# Patient Record
Sex: Male | Born: 1955 | Race: White | Hispanic: No | Marital: Married | Smoking: Never smoker
Health system: Southern US, Community
[De-identification: ages and names within clinical notes are randomized; demographics above are authoritative.]

## PROBLEM LIST (undated history)

## (undated) DIAGNOSIS — N400 Enlarged prostate without lower urinary tract symptoms: Secondary | ICD-10-CM

## (undated) DIAGNOSIS — G4730 Sleep apnea, unspecified: Secondary | ICD-10-CM

## (undated) DIAGNOSIS — R011 Cardiac murmur, unspecified: Secondary | ICD-10-CM

## (undated) DIAGNOSIS — I10 Essential (primary) hypertension: Secondary | ICD-10-CM

## (undated) HISTORY — PX: TONSILLECTOMY: SUR1361

## (undated) HISTORY — DX: Sleep apnea, unspecified: G47.30

## (undated) HISTORY — DX: Essential (primary) hypertension: I10

## (undated) HISTORY — PX: VASECTOMY: SHX75

## (undated) HISTORY — DX: Cardiac murmur, unspecified: R01.1

---

## 2009-02-21 ENCOUNTER — Ambulatory Visit: Payer: Self-pay | Admitting: Gastroenterology

## 2012-07-15 ENCOUNTER — Ambulatory Visit: Payer: Self-pay | Admitting: Internal Medicine

## 2012-08-04 ENCOUNTER — Ambulatory Visit: Payer: Self-pay | Admitting: Internal Medicine

## 2014-05-31 ENCOUNTER — Ambulatory Visit
Admit: 2014-05-31 | Disposition: A | Discharge status: Home / Self Care | Payer: Self-pay | Attending: Internal Medicine | Admitting: Internal Medicine

## 2014-07-05 ENCOUNTER — Other Ambulatory Visit: Payer: Self-pay | Admitting: Urology

## 2014-07-05 DIAGNOSIS — N281 Cyst of kidney, acquired: Secondary | ICD-10-CM

## 2014-07-11 ENCOUNTER — Other Ambulatory Visit: Payer: Self-pay

## 2014-07-11 DIAGNOSIS — N281 Cyst of kidney, acquired: Secondary | ICD-10-CM

## 2014-07-12 ENCOUNTER — Ambulatory Visit
Admission: RE | Admit: 2014-07-12 | Discharge: 2014-07-12 | Disposition: A | Discharge status: Home / Self Care | Payer: 59 | Source: Ambulatory Visit | Attending: Urology | Admitting: Urology

## 2014-07-12 DIAGNOSIS — N281 Cyst of kidney, acquired: Secondary | ICD-10-CM | POA: Diagnosis not present

## 2014-07-26 ENCOUNTER — Ambulatory Visit: Payer: Self-pay | Admitting: Urology

## 2014-08-01 ENCOUNTER — Encounter: Payer: Self-pay | Admitting: *Deleted

## 2014-08-01 ENCOUNTER — Other Ambulatory Visit: Payer: Self-pay | Admitting: *Deleted

## 2014-08-09 ENCOUNTER — Ambulatory Visit: Payer: Self-pay | Admitting: Urology

## 2014-08-12 ENCOUNTER — Telehealth: Payer: Self-pay | Admitting: Urology

## 2014-08-12 NOTE — Telephone Encounter (Signed)
none

## 2014-08-23 ENCOUNTER — Encounter: Payer: Self-pay | Admitting: Urology

## 2014-08-23 ENCOUNTER — Ambulatory Visit (INDEPENDENT_AMBULATORY_CARE_PROVIDER_SITE_OTHER): Payer: 59 | Admitting: Urology

## 2014-08-23 VITALS — BP 152/81 | HR 80 | Ht 69.0 in | Wt 254.1 lb

## 2014-08-23 DIAGNOSIS — N281 Cyst of kidney, acquired: Secondary | ICD-10-CM | POA: Diagnosis not present

## 2014-08-23 NOTE — Progress Notes (Signed)
08/23/2014 3:29 PM   Caleb Li [birth year] [id number]  Referring provider: Tracie Harrier, MD [place], [place] [address]  Chief Complaint  Patient presents with  . Results    HPI: Healthy patient following up evaluation renal ultrasound microhematuria. Patient has multiple small cysts in both kidneys. They are no greater than 1.5 cm to 2 cm in diameter. There are 3 in number patient will maintain routine yearly PSAs and rectal exams. Rectal exam my last visit months ago was negative for any masses growths or nodular he'll be followed by his family doctor and obtain the PSA for this last year from his family doctor. It's not being done we'll be happy to do it.    PMH: Past Medical History  Diagnosis Date  . Heart murmur   . Hypertension   . Sleep apnea     Surgical History: Past Surgical History  Procedure Laterality Date  . Vasectomy    . Tonsillectomy      Home Medications:    Medication List       This list is accurate as of: 08/23/14  3:29 PM.  Always use your most recent med list.               aspirin EC 81 MG tablet  Take 81 mg by mouth daily.     cyclobenzaprine 5 MG tablet  Commonly known as:  FLEXERIL  Take 5 mg by mouth 3 (three) times daily as needed for muscle spasms.     finasteride 5 MG tablet  Commonly known as:  PROSCAR  Take by mouth.     meloxicam 7.5 MG tablet  Commonly known as:  MOBIC  TAKE 1 TABLET BY MOUTH ONCE DAILY.     metoprolol succinate 50 MG 24 hr tablet  Commonly known as:  TOPROL-XL  Take by mouth.     tamsulosin 0.4 MG Caps capsule  Commonly known as:  FLOMAX  TAKE ONE CAPSULE BY MOUTH DAILY        Allergies: No Known Allergies  Family History: Family History  Problem Relation Age of Onset  . Tuberculosis Mother     Social History:  reports that he has never smoked. He does not have any smokeless tobacco history on file. He reports that he does not drink alcohol or use illicit  drugs.  ROS: UROLOGY Frequent Urination?: No Hard to postpone urination?: No Burning/pain with urination?: No Get up at night to urinate?: Yes Leakage of urine?: No Urine stream starts and stops?: No Trouble starting stream?: No Do you have to strain to urinate?: Yes Blood in urine?: No Urinary tract infection?: No Sexually transmitted disease?: No Injury to kidneys or bladder?: No Painful intercourse?: No Weak stream?: No Erection problems?: No Penile pain?: No  Gastrointestinal Nausea?: No Vomiting?: No Indigestion/heartburn?: No Diarrhea?: No Constipation?: No  Constitutional Fever: No Night sweats?: No Weight loss?: No Fatigue?: No  Skin Skin rash/lesions?: No Itching?: No  Eyes Blurred vision?: No Double vision?: No  Ears/Nose/Throat Sore throat?: No Sinus problems?: No  Hematologic/Lymphatic Swollen glands?: No Easy bruising?: No  Cardiovascular Leg swelling?: No Chest pain?: No  Respiratory Cough?: No Shortness of breath?: No  Endocrine Excessive thirst?: No  Musculoskeletal Back pain?: Yes Joint pain?: No  Neurological Headaches?: No Dizziness?: No  Psychologic Depression?: No Anxiety?: No  Physical Exam: BP 152/81 mmHg  Pulse 80  Ht 5\' 9"  (1.753 m)  Wt 254 lb 1.6 oz (115.259 kg)  BMI 37.51  kg/m2  Constitutional:  Alert and oriented, No acute distress. HEENT: [place] AT, moist mucus membranes.  Trachea midline, no masses. Cardiovascular: No clubbing, cyanosis, or edema. Respiratory: Normal respiratory effort, no increased work of breathing. GI: Abdomen is soft, nontender, nondistended, no abdominal masses GU: No CVA tenderness. Bowel sounds present no suprapubic tenderness negative Murphy's Lloyd's McBurney Skin: No rashes, bruises or suspicious lesions. Lymph: No cervical or inguinal adenopathy. Neurologic: Grossly intact, no focal deficits, moving all 4 extremities. Psychiatric: Normal mood and affect.  Laboratory  Data: No results found for: WBC, HGB, HCT, MCV, PLT  No results found for: CREATININE  No results found for: PSA  No results found for: TESTOSTERONE  No results found for: HGBA1C  Urinalysis No results found for: COLORURINE, APPEARANCEUR, LABSPEC, PHURINE, GLUCOSEU, HGBUR, BILIRUBINUR, KETONESUR, PROTEINUR, UROBILINOGEN, NITRITE, LEUKOCYTESUR  Pertinent Imaging: Renal ultrasound showing small cysts on both kidneys  Assessment & Plan: Renal cysts non-clinical. Patient will have yearly PSAs and rectal exam  Diagnosis renal cyst-benign  No Follow-up on file.  Collier Flowers, [facility] [address] [place], [place] [address] [phone]

## 2015-03-29 DIAGNOSIS — N400 Enlarged prostate without lower urinary tract symptoms: Secondary | ICD-10-CM | POA: Diagnosis not present

## 2015-03-29 DIAGNOSIS — Z125 Encounter for screening for malignant neoplasm of prostate: Secondary | ICD-10-CM | POA: Diagnosis not present

## 2015-03-29 DIAGNOSIS — Z0000 Encounter for general adult medical examination without abnormal findings: Secondary | ICD-10-CM | POA: Diagnosis not present

## 2015-03-29 DIAGNOSIS — I10 Essential (primary) hypertension: Secondary | ICD-10-CM | POA: Diagnosis not present

## 2015-04-04 DIAGNOSIS — M546 Pain in thoracic spine: Secondary | ICD-10-CM | POA: Diagnosis not present

## 2015-04-04 DIAGNOSIS — J301 Allergic rhinitis due to pollen: Secondary | ICD-10-CM | POA: Diagnosis not present

## 2015-04-04 DIAGNOSIS — N401 Enlarged prostate with lower urinary tract symptoms: Secondary | ICD-10-CM | POA: Diagnosis not present

## 2015-04-04 DIAGNOSIS — I10 Essential (primary) hypertension: Secondary | ICD-10-CM | POA: Diagnosis not present

## 2015-04-10 MED FILL — TAMSULOSIN HCL 0.4 MG CAP: 0.4 | 30 days supply | Qty: 30 | Fill #0

## 2015-04-10 MED FILL — MELOXICAM 7.5 MG TABLET: 7.5 | 30 days supply | Qty: 30 | Fill #0

## 2015-04-18 MED FILL — CYCLOBENZAPRINE 5 MG TABLET: 5 | 30 days supply | Qty: 30 | Fill #0

## 2015-05-01 MED FILL — FINASTERIDE 5 MG TABLET: 5 | 30 days supply | Qty: 30 | Fill #0

## 2015-05-08 MED FILL — MELOXICAM 7.5 MG TABLET: 7.5 | 30 days supply | Qty: 30 | Fill #1

## 2015-05-08 MED FILL — METOPROLOL SUCC ER 50 MG TA: 50 | 90 days supply | Qty: 90 | Fill #0

## 2015-05-20 MED FILL — TAMSULOSIN HCL 0.4 MG CAP: 0.4 | 30 days supply | Qty: 30 | Fill #1

## 2015-05-24 MED FILL — FINASTERIDE 5 MG TABLET: 5 | 30 days supply | Qty: 30 | Fill #1

## 2015-05-25 MED FILL — CYCLOBENZAPRINE 5 MG TABLET: 5 | 30 days supply | Qty: 30 | Fill #1

## 2015-06-09 DIAGNOSIS — H6123 Impacted cerumen, bilateral: Secondary | ICD-10-CM | POA: Diagnosis not present

## 2015-06-09 DIAGNOSIS — J301 Allergic rhinitis due to pollen: Secondary | ICD-10-CM | POA: Diagnosis not present

## 2015-06-09 DIAGNOSIS — H903 Sensorineural hearing loss, bilateral: Secondary | ICD-10-CM | POA: Diagnosis not present

## 2015-06-09 MED FILL — PAZEO 0.7% EYE DROPS: 0.7 | 25 days supply | Qty: 3 | Fill #0

## 2015-06-11 MED FILL — MELOXICAM 7.5 MG TABLET: 7.5 | 30 days supply | Qty: 30 | Fill #2

## 2015-06-22 MED FILL — TAMSULOSIN HCL 0.4 MG CAP: 0.4 | 30 days supply | Qty: 30 | Fill #2

## 2015-06-25 MED FILL — PAZEO 0.7% EYE DROPS: 0.7 | 45 days supply | Qty: 5 | Fill #0

## 2015-07-04 MED FILL — CYCLOBENZAPRINE 5 MG TABLET: 5 | 30 days supply | Qty: 30 | Fill #2

## 2015-07-04 MED FILL — FINASTERIDE 5 MG TABLET: 5 | 30 days supply | Qty: 30 | Fill #2

## 2015-07-17 MED FILL — TAMSULOSIN HCL 0.4 MG CAP: 0.4 | 30 days supply | Qty: 30 | Fill #3

## 2015-07-17 MED FILL — MELOXICAM 7.5 MG TABLET: 7.5 | 30 days supply | Qty: 30 | Fill #3

## 2015-07-26 MED FILL — CYCLOBENZAPRINE 5 MG TABLET: 5 | 30 days supply | Qty: 30 | Fill #0

## 2015-07-26 MED FILL — FINASTERIDE 5 MG TABLET: 5 | 30 days supply | Qty: 30 | Fill #3

## 2015-08-07 MED FILL — METOPROLOL SUCC ER 50 MG TA: 50 | 90 days supply | Qty: 90 | Fill #1

## 2015-08-09 MED FILL — MELOXICAM 7.5 MG TABLET: 7.5 | 30 days supply | Qty: 30 | Fill #4

## 2015-08-21 MED FILL — TAMSULOSIN HCL 0.4 MG CAP: 0.4 | 30 days supply | Qty: 30 | Fill #4

## 2015-08-28 DIAGNOSIS — L570 Actinic keratosis: Secondary | ICD-10-CM | POA: Diagnosis not present

## 2015-08-28 DIAGNOSIS — X32XXXA Exposure to sunlight, initial encounter: Secondary | ICD-10-CM | POA: Diagnosis not present

## 2015-08-28 DIAGNOSIS — D2339 Other benign neoplasm of skin of other parts of face: Secondary | ICD-10-CM | POA: Diagnosis not present

## 2015-08-28 DIAGNOSIS — L538 Other specified erythematous conditions: Secondary | ICD-10-CM | POA: Diagnosis not present

## 2015-08-28 DIAGNOSIS — S0030XA Unspecified superficial injury of nose, initial encounter: Secondary | ICD-10-CM | POA: Diagnosis not present

## 2015-08-28 DIAGNOSIS — L232 Allergic contact dermatitis due to cosmetics: Secondary | ICD-10-CM | POA: Diagnosis not present

## 2015-08-28 DIAGNOSIS — D225 Melanocytic nevi of trunk: Secondary | ICD-10-CM | POA: Diagnosis not present

## 2015-08-28 DIAGNOSIS — D485 Neoplasm of uncertain behavior of skin: Secondary | ICD-10-CM | POA: Diagnosis not present

## 2015-08-28 DIAGNOSIS — L821 Other seborrheic keratosis: Secondary | ICD-10-CM | POA: Diagnosis not present

## 2015-08-28 MED FILL — FINASTERIDE 5 MG TABLET: 5 | 30 days supply | Qty: 30 | Fill #4

## 2015-08-28 MED FILL — DRYSOL DAB-O-MATIC SOLUTION: 20 | 20 days supply | Qty: 35 | Fill #0

## 2015-08-31 MED FILL — PAZEO 0.7% EYE DROPS: 0.7 | 45 days supply | Qty: 5 | Fill #1

## 2015-09-03 MED FILL — CYCLOBENZAPRINE 5 MG TABLET: 5 | 30 days supply | Qty: 30 | Fill #1

## 2015-09-11 MED FILL — MELOXICAM 7.5 MG TABLET: 7.5 | 30 days supply | Qty: 30 | Fill #0

## 2015-09-24 MED FILL — FINASTERIDE 5 MG TABLET: 5 | 30 days supply | Qty: 30 | Fill #5

## 2015-09-27 MED FILL — TAMSULOSIN HCL 0.4 MG CAP: 0.4 | 30 days supply | Qty: 30 | Fill #5

## 2015-10-02 DIAGNOSIS — G4733 Obstructive sleep apnea (adult) (pediatric): Secondary | ICD-10-CM | POA: Diagnosis not present

## 2015-10-07 MED FILL — CYCLOBENZAPRINE 5 MG TABLET: 5 | 30 days supply | Qty: 30 | Fill #2

## 2015-10-10 MED FILL — MELOXICAM 7.5 MG TABLET: 7.5 | 30 days supply | Qty: 30 | Fill #1

## 2015-10-23 MED FILL — TAMSULOSIN HCL 0.4 MG CAP: 0.4 | 30 days supply | Qty: 30 | Fill #6

## 2015-10-31 MED FILL — METOPROLOL SUCC ER 50 MG TA: 50 | 90 days supply | Qty: 90 | Fill #2

## 2015-10-31 MED FILL — FINASTERIDE 5 MG TABLET: 5 | 30 days supply | Qty: 30 | Fill #6

## 2015-11-06 MED FILL — CYCLOBENZAPRINE 5 MG TABLET: 5 | 30 days supply | Qty: 30 | Fill #0

## 2015-11-06 MED FILL — MELOXICAM 7.5 MG TABLET: 7.5 | 30 days supply | Qty: 30 | Fill #2

## 2015-11-27 MED FILL — TAMSULOSIN HCL 0.4 MG CAP: 0.4 | 30 days supply | Qty: 30 | Fill #7

## 2015-11-27 MED FILL — FINASTERIDE 5 MG TABLET: 5 | 30 days supply | Qty: 30 | Fill #7

## 2015-12-04 DIAGNOSIS — Z889 Allergy status to unspecified drugs, medicaments and biological substances status: Secondary | ICD-10-CM | POA: Diagnosis not present

## 2015-12-04 DIAGNOSIS — I10 Essential (primary) hypertension: Secondary | ICD-10-CM | POA: Diagnosis not present

## 2015-12-04 DIAGNOSIS — R635 Abnormal weight gain: Secondary | ICD-10-CM | POA: Diagnosis not present

## 2015-12-04 DIAGNOSIS — R002 Palpitations: Secondary | ICD-10-CM | POA: Diagnosis not present

## 2015-12-05 MED FILL — CYCLOBENZAPRINE 5 MG TABLET: 5 | 30 days supply | Qty: 30 | Fill #1

## 2015-12-09 MED FILL — MELOXICAM 7.5 MG TABLET: 7.5 | 30 days supply | Qty: 30 | Fill #3

## 2015-12-12 DIAGNOSIS — R002 Palpitations: Secondary | ICD-10-CM | POA: Diagnosis not present

## 2015-12-26 MED FILL — FINASTERIDE 5 MG TABLET: 5 | 30 days supply | Qty: 30 | Fill #0

## 2015-12-26 MED FILL — TAMSULOSIN HCL 0.4 MG CAP: 0.4 | 30 days supply | Qty: 30 | Fill #0

## 2016-01-05 MED FILL — METOPROLOL SUCC ER 50 MG TA: 50 | 90 days supply | Qty: 135 | Fill #0

## 2016-01-05 MED FILL — CYCLOBENZAPRINE 5 MG TABLET: 5 | 30 days supply | Qty: 30 | Fill #2

## 2016-01-23 DIAGNOSIS — Z889 Allergy status to unspecified drugs, medicaments and biological substances status: Secondary | ICD-10-CM | POA: Diagnosis not present

## 2016-01-23 DIAGNOSIS — R002 Palpitations: Secondary | ICD-10-CM | POA: Diagnosis not present

## 2016-01-23 DIAGNOSIS — I10 Essential (primary) hypertension: Secondary | ICD-10-CM | POA: Diagnosis not present

## 2016-01-23 DIAGNOSIS — R635 Abnormal weight gain: Secondary | ICD-10-CM | POA: Diagnosis not present

## 2016-01-23 MED FILL — FINASTERIDE 5 MG TABLET: 5 | 30 days supply | Qty: 30 | Fill #1

## 2016-01-23 MED FILL — TAMSULOSIN HCL 0.4 MG CAP: 0.4 | 30 days supply | Qty: 30 | Fill #1

## 2016-01-30 DIAGNOSIS — Z0000 Encounter for general adult medical examination without abnormal findings: Secondary | ICD-10-CM | POA: Diagnosis not present

## 2016-01-30 DIAGNOSIS — M546 Pain in thoracic spine: Secondary | ICD-10-CM | POA: Diagnosis not present

## 2016-01-30 DIAGNOSIS — J301 Allergic rhinitis due to pollen: Secondary | ICD-10-CM | POA: Diagnosis not present

## 2016-01-30 DIAGNOSIS — I10 Essential (primary) hypertension: Secondary | ICD-10-CM | POA: Diagnosis not present

## 2016-02-06 MED FILL — CYCLOBENZAPRINE 5 MG TABLET: 5 | 30 days supply | Qty: 30 | Fill #0

## 2016-02-08 DIAGNOSIS — H5213 Myopia, bilateral: Secondary | ICD-10-CM | POA: Diagnosis not present

## 2016-02-08 DIAGNOSIS — H524 Presbyopia: Secondary | ICD-10-CM | POA: Diagnosis not present

## 2016-02-26 MED FILL — TAMSULOSIN HCL 0.4 MG CAP: 0.4 | 30 days supply | Qty: 30 | Fill #2

## 2016-02-26 MED FILL — FINASTERIDE 5 MG TABLET: 5 | 30 days supply | Qty: 30 | Fill #2

## 2016-03-14 MED FILL — CYCLOBENZAPRINE 5 MG TABLET: 5 | 30 days supply | Qty: 30 | Fill #1

## 2016-03-25 MED FILL — METOPROLOL SUCC ER 50 MG TA: 50 | 90 days supply | Qty: 135 | Fill #1

## 2016-03-25 MED FILL — MELOXICAM 7.5 MG TABLET: 7.5 | 30 days supply | Qty: 30 | Fill #4

## 2016-03-25 MED FILL — FINASTERIDE 5 MG TABLET: 5 | 30 days supply | Qty: 30 | Fill #3

## 2016-03-25 MED FILL — TAMSULOSIN HCL 0.4 MG CAP: 0.4 | 30 days supply | Qty: 30 | Fill #3

## 2016-04-15 MED FILL — CYCLOBENZAPRINE 5 MG TABLET: 5 | 30 days supply | Qty: 30 | Fill #2

## 2016-04-28 MED FILL — FINASTERIDE 5 MG TABLET: 5 | 30 days supply | Qty: 30 | Fill #4

## 2016-04-28 MED FILL — TAMSULOSIN HCL 0.4 MG CAP: 0.4 | 30 days supply | Qty: 30 | Fill #4

## 2016-05-22 MED FILL — CYCLOBENZAPRINE 5 MG TABLET: 5 | 30 days supply | Qty: 30 | Fill #0

## 2016-06-02 MED FILL — TAMSULOSIN HCL 0.4 MG CAP: 0.4 | 30 days supply | Qty: 30 | Fill #5

## 2016-06-02 MED FILL — FINASTERIDE 5 MG TABLET: 5 | 30 days supply | Qty: 30 | Fill #5

## 2016-06-03 MED FILL — PAZEO 0.7% EYE DROPS: 0.7 | 30 days supply | Qty: 3 | Fill #2

## 2016-06-17 MED FILL — CYCLOBENZAPRINE 5 MG TABLET: 5 | 30 days supply | Qty: 30 | Fill #1

## 2016-06-24 MED FILL — MELOXICAM 7.5 MG TABLET: 7.5 | 30 days supply | Qty: 30 | Fill #0

## 2016-07-01 MED FILL — METOPROLOL SUCC ER 50 MG TA: 50 | 90 days supply | Qty: 135 | Fill #2

## 2016-07-01 MED FILL — TAMSULOSIN HCL 0.4 MG CAP: 0.4 | 30 days supply | Qty: 30 | Fill #6

## 2016-07-01 MED FILL — FINASTERIDE 5 MG TABLET: 5 | 30 days supply | Qty: 30 | Fill #6

## 2016-07-20 MED FILL — CYCLOBENZAPRINE 5 MG TABLET: 5 | 30 days supply | Qty: 30 | Fill #2

## 2016-07-22 MED FILL — MELOXICAM 7.5 MG TABLET: 7.5 | 30 days supply | Qty: 30 | Fill #1

## 2016-08-02 MED FILL — FINASTERIDE 5 MG TABLET: 5 | 30 days supply | Qty: 30 | Fill #7

## 2016-08-02 MED FILL — TAMSULOSIN HCL 0.4 MG CAP: 0.4 | 30 days supply | Qty: 30 | Fill #7

## 2016-08-20 MED FILL — CYCLOBENZAPRINE 5 MG TABLET: 5 | 30 days supply | Qty: 30 | Fill #0

## 2016-08-26 MED FILL — MELOXICAM 7.5 MG TABLET: 7.5 | 30 days supply | Qty: 30 | Fill #2

## 2016-08-31 MED FILL — FINASTERIDE 5 MG TABLET: 5 | 30 days supply | Qty: 30 | Fill #8

## 2016-08-31 MED FILL — TAMSULOSIN HCL 0.4 MG CAP: 0.4 | 30 days supply | Qty: 30 | Fill #8

## 2016-09-21 MED FILL — CYCLOBENZAPRINE 5 MG TABLET: 5 | 30 days supply | Qty: 30 | Fill #1

## 2016-09-23 MED FILL — DRYSOL DAB-O-MATIC SOLUTION: 20 | 20 days supply | Qty: 35 | Fill #0

## 2016-09-30 MED FILL — TAMSULOSIN HCL 0.4 MG CAP: 0.4 | 30 days supply | Qty: 30 | Fill #9

## 2016-09-30 MED FILL — METOPROLOL SUCC ER 50 MG TA: 50 | 90 days supply | Qty: 135 | Fill #3

## 2016-09-30 MED FILL — FINASTERIDE 5 MG TABLET: 5 | 30 days supply | Qty: 30 | Fill #9

## 2016-10-21 MED FILL — CYCLOBENZAPRINE 5 MG TABLET: 5 | 30 days supply | Qty: 30 | Fill #2

## 2016-11-07 MED FILL — TAMSULOSIN HCL 0.4 MG CAP: 0.4 | 30 days supply | Qty: 30 | Fill #0

## 2016-11-07 MED FILL — FINASTERIDE 5 MG TABLET: 5 | 30 days supply | Qty: 30 | Fill #10

## 2016-11-18 MED FILL — CYCLOBENZAPRINE 5 MG TABLET: 5 | 30 days supply | Qty: 30 | Fill #0

## 2016-11-18 MED FILL — MELOXICAM 7.5 MG TABLET: 7.5 | 30 days supply | Qty: 30 | Fill #3

## 2016-12-08 MED FILL — TAMSULOSIN HCL 0.4 MG CAP: 0.4 | 30 days supply | Qty: 30 | Fill #1

## 2016-12-09 MED FILL — FINASTERIDE 5 MG TABLET: 5 | 30 days supply | Qty: 30 | Fill #0

## 2016-12-12 DIAGNOSIS — D2371 Other benign neoplasm of skin of right lower limb, including hip: Secondary | ICD-10-CM | POA: Diagnosis not present

## 2016-12-12 DIAGNOSIS — L821 Other seborrheic keratosis: Secondary | ICD-10-CM | POA: Diagnosis not present

## 2016-12-21 MED FILL — CYCLOBENZAPRINE 5 MG TABLET: 5 | 30 days supply | Qty: 30 | Fill #1

## 2016-12-30 MED FILL — METOPROLOL SUCC ER 50 MG TA: 50 | 90 days supply | Qty: 135 | Fill #0

## 2017-01-01 MED FILL — TAMSULOSIN HCL 0.4 MG CAP: 0.4 | 30 days supply | Qty: 30 | Fill #2

## 2017-01-02 MED FILL — FINASTERIDE 5 MG TABLET: 5 | 30 days supply | Qty: 30 | Fill #1

## 2017-01-18 MED FILL — CYCLOBENZAPRINE 5 MG TABLET: 5 | 30 days supply | Qty: 30 | Fill #2

## 2017-01-22 DIAGNOSIS — Z6839 Body mass index (BMI) 39.0-39.9, adult: Secondary | ICD-10-CM | POA: Diagnosis not present

## 2017-01-22 DIAGNOSIS — R3129 Other microscopic hematuria: Secondary | ICD-10-CM | POA: Diagnosis not present

## 2017-01-22 DIAGNOSIS — Z0000 Encounter for general adult medical examination without abnormal findings: Secondary | ICD-10-CM | POA: Diagnosis not present

## 2017-01-22 DIAGNOSIS — G8929 Other chronic pain: Secondary | ICD-10-CM | POA: Diagnosis not present

## 2017-01-22 DIAGNOSIS — M546 Pain in thoracic spine: Secondary | ICD-10-CM | POA: Diagnosis not present

## 2017-01-22 DIAGNOSIS — R972 Elevated prostate specific antigen [PSA]: Secondary | ICD-10-CM | POA: Diagnosis not present

## 2017-01-22 DIAGNOSIS — J301 Allergic rhinitis due to pollen: Secondary | ICD-10-CM | POA: Diagnosis not present

## 2017-01-22 DIAGNOSIS — I10 Essential (primary) hypertension: Secondary | ICD-10-CM | POA: Diagnosis not present

## 2017-01-29 DIAGNOSIS — Z0000 Encounter for general adult medical examination without abnormal findings: Secondary | ICD-10-CM | POA: Diagnosis not present

## 2017-01-29 DIAGNOSIS — M546 Pain in thoracic spine: Secondary | ICD-10-CM | POA: Diagnosis not present

## 2017-01-29 DIAGNOSIS — I10 Essential (primary) hypertension: Secondary | ICD-10-CM | POA: Diagnosis not present

## 2017-01-29 DIAGNOSIS — Z6837 Body mass index (BMI) 37.0-37.9, adult: Secondary | ICD-10-CM | POA: Diagnosis not present

## 2017-01-29 MED FILL — SHINGRIX 50 MCG SUS: 50 | 1 days supply | Qty: 1 | Fill #0

## 2017-02-02 MED FILL — FINASTERIDE 5 MG TABLET: 5 | 30 days supply | Qty: 30 | Fill #2

## 2017-02-02 MED FILL — TAMSULOSIN HCL 0.4 MG CAP: 0.4 | 30 days supply | Qty: 30 | Fill #3

## 2017-02-11 DIAGNOSIS — H5213 Myopia, bilateral: Secondary | ICD-10-CM | POA: Diagnosis not present

## 2017-02-11 DIAGNOSIS — H524 Presbyopia: Secondary | ICD-10-CM | POA: Diagnosis not present

## 2017-02-11 DIAGNOSIS — H52221 Regular astigmatism, right eye: Secondary | ICD-10-CM | POA: Diagnosis not present

## 2017-02-16 IMAGING — US US RENAL
1 series · 14 of 25 positions shown · non-contrast
Comparison: None.

CLINICAL DATA: Renal cyst

EXAM:
RENAL / URINARY TRACT ULTRASOUND COMPLETE

[Series 1: us renal · 0.25mm/px · 14 of 49 slices shown]
[im 1/49]
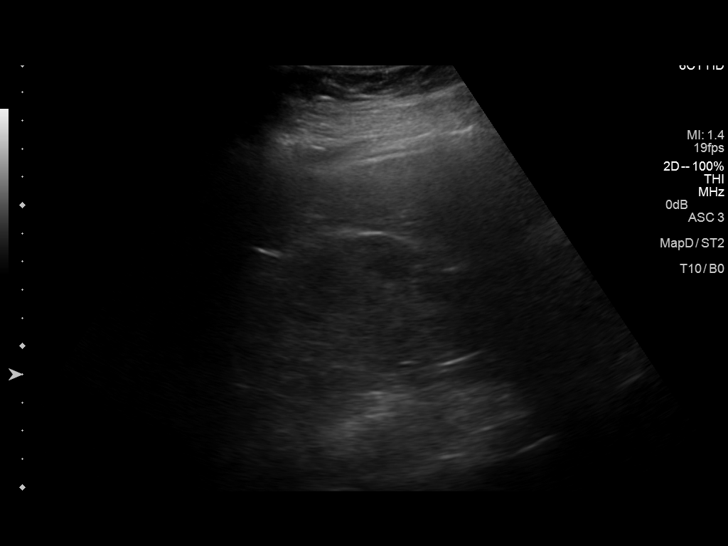
[im 5/49]
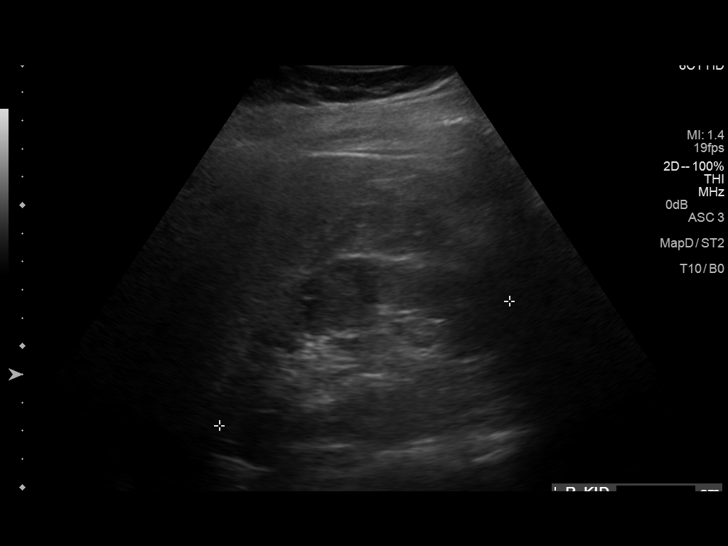
[im 9/49]
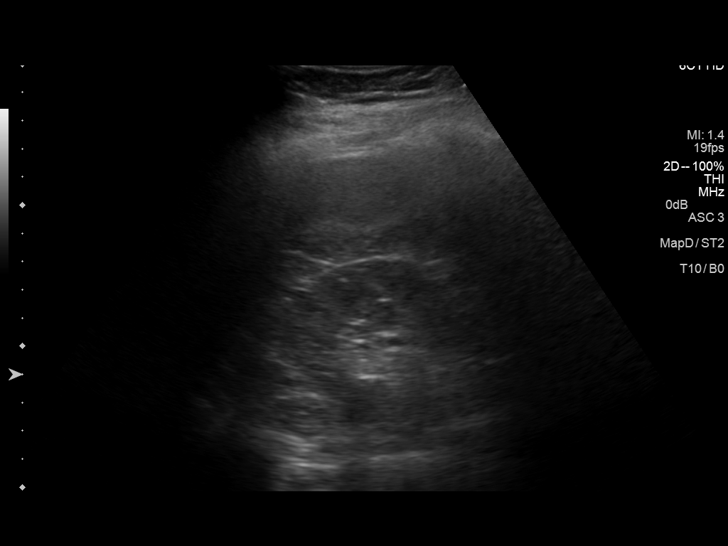
[im 13/49]
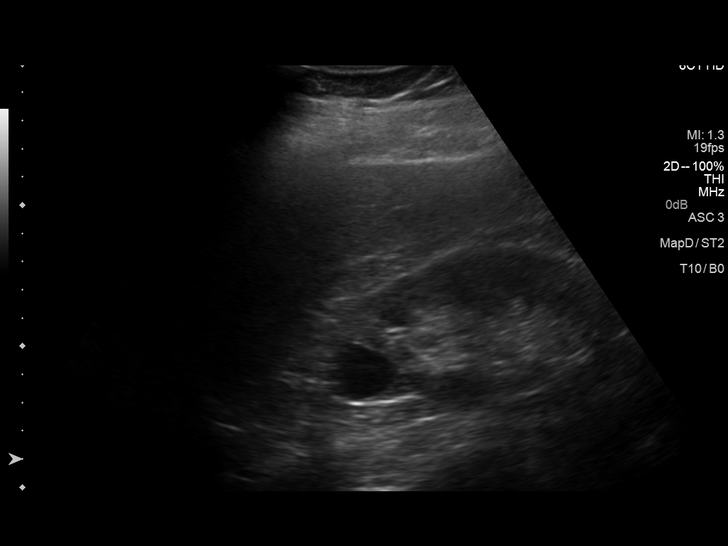
[im 17/49]
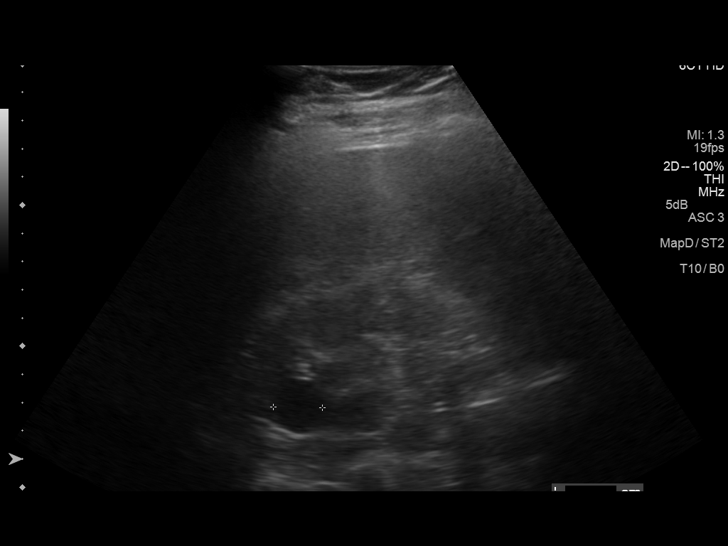
[im 19/49]
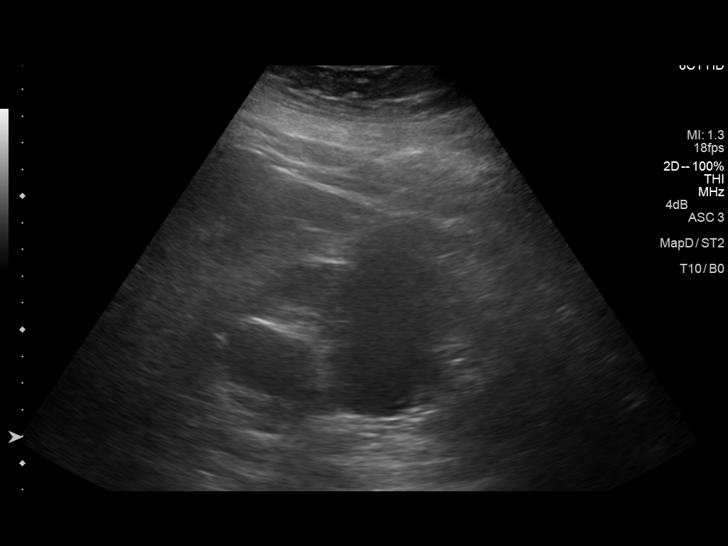
[im 23/49]
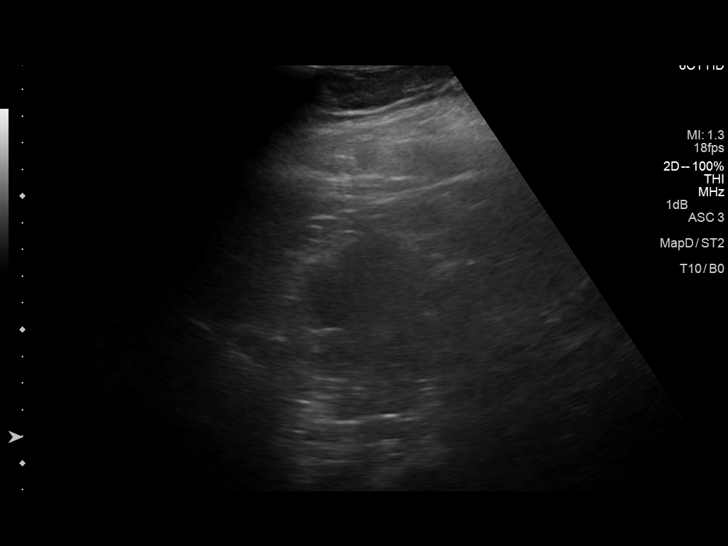
[im 27/49]
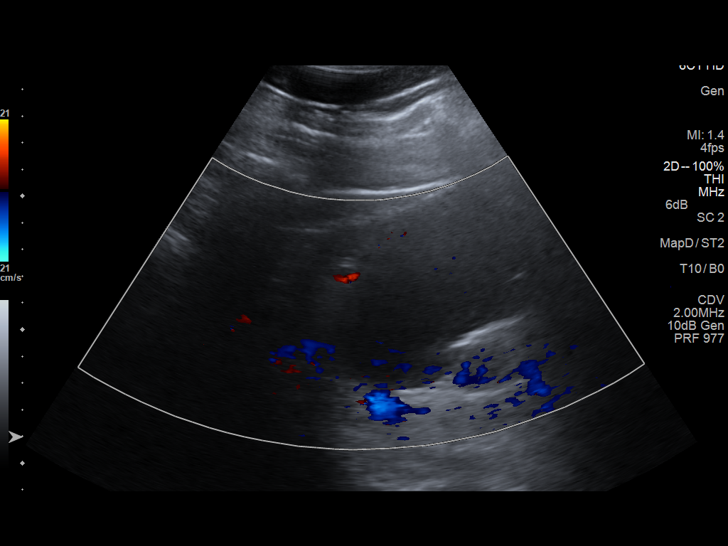
[im 31/49]
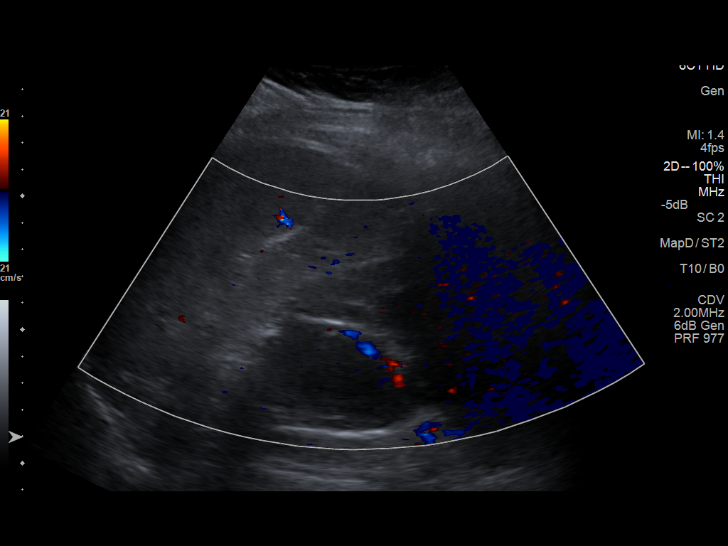
[im 33/49]
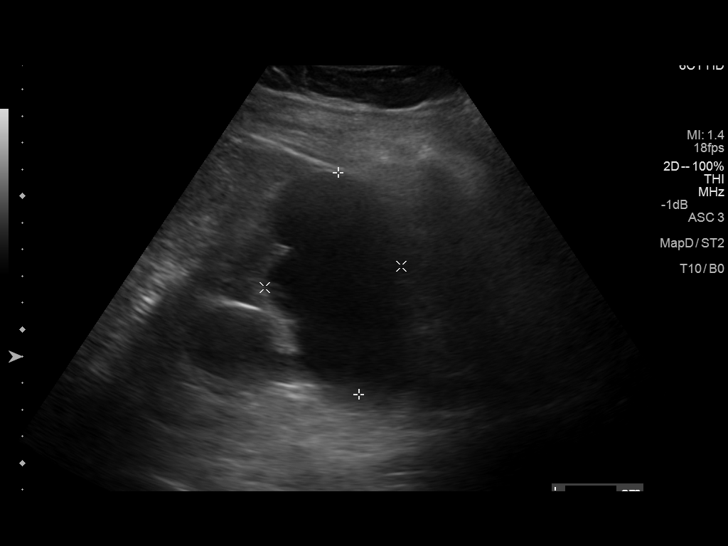
[im 37/49]
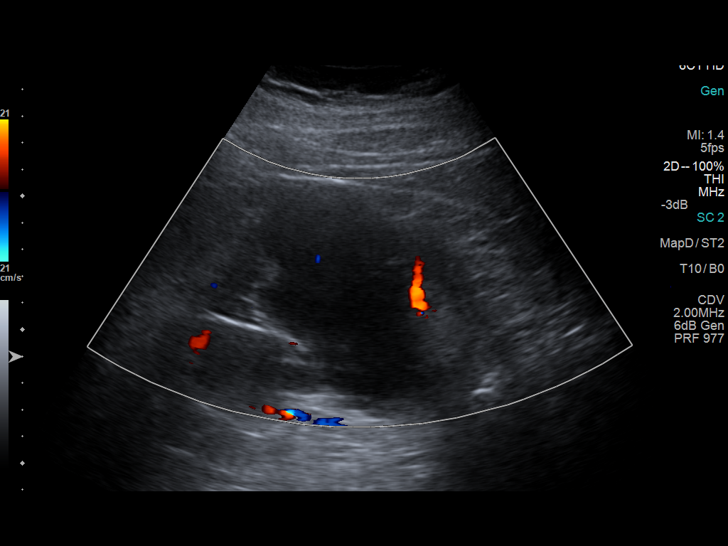
[im 41/49]
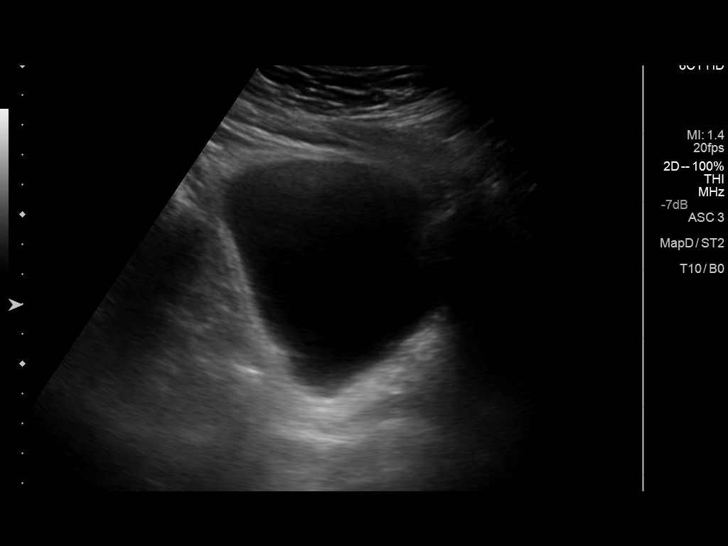
[im 45/49]
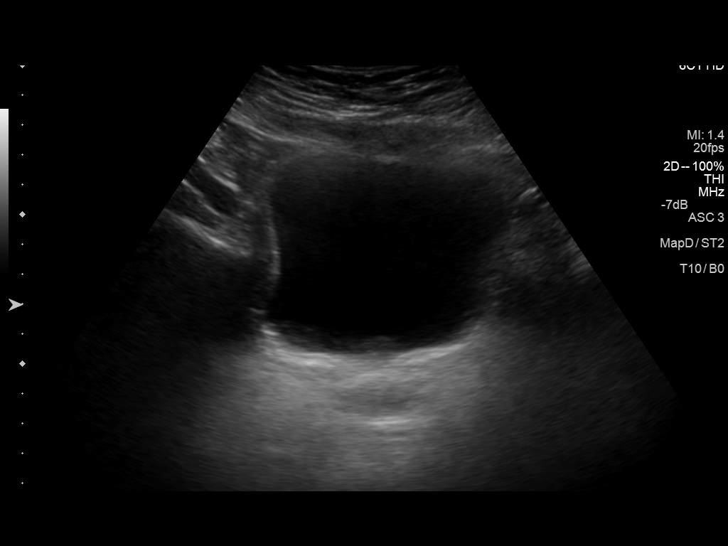
[im 49/49]
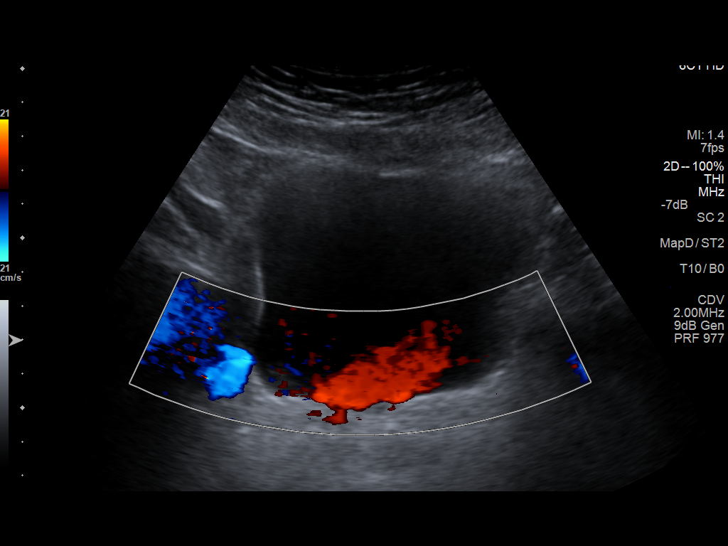

[14 of 25 positions shown; findings below may reference images not displayed]

FINDINGS: Right Kidney:

Length: 11.2 cm. Echogenicity within normal limits. There is a 1.9 x
1.9 x 1.7 cm anechoic right upper pole renal mass most consistent
with a cyst. No solid mass or hydronephrosis visualized.

Left Kidney:

Length: 14.5 cm. Echogenicity within normal limits. There is a 8.3 x
5.2 x 5.3 cm anechoic left interpolar renal mass most consistent
with a cyst. There is a 5 x 3.9 x 4.6 cm right upper pole renal mass
most consistent with a cyst. No solid mass or hydronephrosis
visualized.

Bladder:

Appears normal for degree of bladder distention.
IMPRESSION: 1. Bilateral renal cysts.
2. No obstructive uropathy.

## 2017-02-17 MED FILL — CYCLOBENZAPRINE 5 MG TABLET: 5 | 30 days supply | Qty: 30 | Fill #0

## 2017-02-24 DIAGNOSIS — J301 Allergic rhinitis due to pollen: Secondary | ICD-10-CM | POA: Diagnosis not present

## 2017-02-24 DIAGNOSIS — H903 Sensorineural hearing loss, bilateral: Secondary | ICD-10-CM | POA: Diagnosis not present

## 2017-02-24 DIAGNOSIS — H6123 Impacted cerumen, bilateral: Secondary | ICD-10-CM | POA: Diagnosis not present

## 2017-02-28 MED FILL — PAZEO 0.7% EYE DROPS: 0.7 | 30 days supply | Qty: 3 | Fill #0

## 2017-03-03 MED FILL — FINASTERIDE 5 MG TABLET: 5 | 90 days supply | Qty: 90 | Fill #3

## 2017-03-03 MED FILL — TAMSULOSIN HCL 0.4 MG CAP: 0.4 | 30 days supply | Qty: 30 | Fill #4

## 2017-03-06 DIAGNOSIS — H903 Sensorineural hearing loss, bilateral: Secondary | ICD-10-CM | POA: Diagnosis not present

## 2017-03-18 MED FILL — METOPROLOL SUCC ER 50 MG TA: 50 | 90 days supply | Qty: 135 | Fill #1

## 2017-03-18 MED FILL — CYCLOBENZAPRINE 5 MG TABLET: 5 | 30 days supply | Qty: 30 | Fill #1

## 2017-03-28 MED FILL — TAMSULOSIN HCL 0.4 MG CAP: 0.4 | 90 days supply | Qty: 90 | Fill #0

## 2017-03-31 DIAGNOSIS — G4733 Obstructive sleep apnea (adult) (pediatric): Secondary | ICD-10-CM | POA: Diagnosis not present

## 2017-04-01 MED FILL — SHINGRIX 50 MCG SUS: 50 | 1 days supply | Qty: 1 | Fill #1

## 2017-04-10 MED FILL — MELOXICAM 7.5 MG TABLET: 7.5 | 30 days supply | Qty: 30 | Fill #4

## 2017-04-11 MED FILL — CYCLOBENZAPRINE 5 MG TABLET: 5 | 30 days supply | Qty: 30 | Fill #2

## 2017-05-26 MED FILL — FINASTERIDE 5 MG TABLET: 5 | 90 days supply | Qty: 90 | Fill #4

## 2017-05-26 MED FILL — CYCLOBENZAPRINE 5 MG TABLET: 5 | 30 days supply | Qty: 30 | Fill #0

## 2017-06-23 MED FILL — CYCLOBENZAPRINE 5 MG TABLET: 5 | 30 days supply | Qty: 30 | Fill #1

## 2017-06-23 MED FILL — PAZEO 0.7% EYE DROPS: 0.7 | 30 days supply | Qty: 3 | Fill #1

## 2017-06-23 MED FILL — METOPROLOL SUCCINATE ER 50: 50 | 90 days supply | Qty: 135 | Fill #2

## 2017-07-02 MED FILL — TAMSULOSIN HCL 0.4 MG CAP: 0.4 | 90 days supply | Qty: 90 | Fill #1

## 2017-07-14 MED FILL — CYCLOBENZAPRINE 5 MG TABLET: 5 | 30 days supply | Qty: 30 | Fill #2

## 2017-08-08 MED FILL — DRYSOL DAB-O-MATIC SOLUTION: 20 | 28 days supply | Qty: 35 | Fill #0

## 2017-08-25 MED FILL — FINASTERIDE 5 MG TABLET: 5 | 60 days supply | Qty: 60 | Fill #5

## 2017-08-25 MED FILL — CYCLOBENZAPRINE HCL 5 MG TA: 5 | 30 days supply | Qty: 30 | Fill #0

## 2017-09-19 MED FILL — CYCLOBENZAPRINE HCL 5 MG TA: 5 | 30 days supply | Qty: 30 | Fill #1

## 2017-09-19 MED FILL — METOPROLOL SUCCINATE ER 50: 50 | 90 days supply | Qty: 135 | Fill #3

## 2017-10-07 MED FILL — TAMSULOSIN HCL 0.4 MG CAP: 0.4 | 30 days supply | Qty: 30 | Fill #5

## 2017-10-08 MED FILL — MELOXICAM 7.5 MG TABLET: 7.5 | 30 days supply | Qty: 30 | Fill #0

## 2017-10-17 MED FILL — CYCLOBENZAPRINE HCL 5 MG TA: 5 | 30 days supply | Qty: 30 | Fill #2

## 2017-10-28 DIAGNOSIS — M545 Low back pain: Secondary | ICD-10-CM | POA: Diagnosis not present

## 2017-10-28 DIAGNOSIS — M546 Pain in thoracic spine: Secondary | ICD-10-CM | POA: Diagnosis not present

## 2017-10-28 DIAGNOSIS — Z6837 Body mass index (BMI) 37.0-37.9, adult: Secondary | ICD-10-CM | POA: Diagnosis not present

## 2017-10-30 ENCOUNTER — Other Ambulatory Visit (HOSPITAL_COMMUNITY): Payer: Self-pay | Admitting: Neurological Surgery

## 2017-10-30 DIAGNOSIS — M545 Low back pain: Secondary | ICD-10-CM

## 2017-11-03 ENCOUNTER — Other Ambulatory Visit (HOSPITAL_COMMUNITY): Payer: Self-pay | Admitting: Neurological Surgery

## 2017-11-03 DIAGNOSIS — M545 Low back pain: Secondary | ICD-10-CM

## 2017-11-04 ENCOUNTER — Ambulatory Visit (HOSPITAL_COMMUNITY)
Admission: RE | Admit: 2017-11-04 | Discharge: 2017-11-04 | Disposition: A | Discharge status: Home / Self Care | Payer: 59 | Source: Ambulatory Visit | Attending: Neurological Surgery | Admitting: Neurological Surgery

## 2017-11-04 DIAGNOSIS — M546 Pain in thoracic spine: Secondary | ICD-10-CM | POA: Diagnosis not present

## 2017-11-04 DIAGNOSIS — M48061 Spinal stenosis, lumbar region without neurogenic claudication: Secondary | ICD-10-CM | POA: Insufficient documentation

## 2017-11-04 DIAGNOSIS — M545 Low back pain, unspecified: Secondary | ICD-10-CM

## 2017-11-04 DIAGNOSIS — M5126 Other intervertebral disc displacement, lumbar region: Secondary | ICD-10-CM | POA: Diagnosis not present

## 2017-11-04 DIAGNOSIS — M5136 Other intervertebral disc degeneration, lumbar region: Secondary | ICD-10-CM | POA: Diagnosis not present

## 2017-11-06 MED FILL — FINASTERIDE 5 MG TABLET: 5 | 30 days supply | Qty: 30 | Fill #0

## 2017-11-06 MED FILL — MELOXICAM 7.5 MG TABLET: 7.5 | 30 days supply | Qty: 30 | Fill #1

## 2017-11-06 MED FILL — TAMSULOSIN HCL 0.4 MG CAP: 0.4 | 30 days supply | Qty: 30 | Fill #0

## 2017-11-11 DIAGNOSIS — M4316 Spondylolisthesis, lumbar region: Secondary | ICD-10-CM | POA: Diagnosis not present

## 2017-11-17 MED FILL — CYCLOBENZAPRINE HCL 5 MG TA: 5 | 30 days supply | Qty: 30 | Fill #0

## 2017-12-04 MED FILL — TAMSULOSIN HCL 0.4 MG CAP: 0.4 | 30 days supply | Qty: 30 | Fill #1

## 2017-12-04 MED FILL — FINASTERIDE 5 MG TABLET: 5 | 30 days supply | Qty: 30 | Fill #1

## 2017-12-04 MED FILL — MELOXICAM 7.5 MG TABLET: 7.5 | 30 days supply | Qty: 30 | Fill #2

## 2017-12-04 MED FILL — METOPROLOL SUCCINATE ER 50: 50 | 90 days supply | Qty: 135 | Fill #0

## 2017-12-19 MED FILL — CYCLOBENZAPRINE HCL 5 MG TA: 5 | 30 days supply | Qty: 30 | Fill #1

## 2018-01-05 DIAGNOSIS — M47816 Spondylosis without myelopathy or radiculopathy, lumbar region: Secondary | ICD-10-CM | POA: Diagnosis not present

## 2018-01-05 MED FILL — TAMSULOSIN HCL 0.4 MG CAP: 0.4 | 30 days supply | Qty: 30 | Fill #2

## 2018-01-05 MED FILL — FINASTERIDE 5 MG TABLET: 5 | 30 days supply | Qty: 30 | Fill #2

## 2018-01-05 MED FILL — MELOXICAM 7.5 MG TABLET: 7.5 | 30 days supply | Qty: 30 | Fill #3

## 2018-01-17 MED FILL — CYCLOBENZAPRINE HCL 5 MG TA: 5 | 30 days supply | Qty: 30 | Fill #2

## 2018-01-19 DIAGNOSIS — M47816 Spondylosis without myelopathy or radiculopathy, lumbar region: Secondary | ICD-10-CM | POA: Diagnosis not present

## 2018-01-26 DIAGNOSIS — Z0000 Encounter for general adult medical examination without abnormal findings: Secondary | ICD-10-CM | POA: Diagnosis not present

## 2018-01-26 DIAGNOSIS — M546 Pain in thoracic spine: Secondary | ICD-10-CM | POA: Diagnosis not present

## 2018-01-26 DIAGNOSIS — Z125 Encounter for screening for malignant neoplasm of prostate: Secondary | ICD-10-CM | POA: Diagnosis not present

## 2018-01-26 DIAGNOSIS — Z6837 Body mass index (BMI) 37.0-37.9, adult: Secondary | ICD-10-CM | POA: Diagnosis not present

## 2018-01-26 DIAGNOSIS — I10 Essential (primary) hypertension: Secondary | ICD-10-CM | POA: Diagnosis not present

## 2018-02-02 DIAGNOSIS — Z0000 Encounter for general adult medical examination without abnormal findings: Secondary | ICD-10-CM | POA: Diagnosis not present

## 2018-02-02 DIAGNOSIS — I10 Essential (primary) hypertension: Secondary | ICD-10-CM | POA: Diagnosis not present

## 2018-02-02 DIAGNOSIS — N400 Enlarged prostate without lower urinary tract symptoms: Secondary | ICD-10-CM | POA: Diagnosis not present

## 2018-02-02 DIAGNOSIS — M546 Pain in thoracic spine: Secondary | ICD-10-CM | POA: Diagnosis not present

## 2018-02-02 MED FILL — MELOXICAM 7.5 MG TABLET: 7.5 | 90 days supply | Qty: 90 | Fill #0

## 2018-02-02 MED FILL — FINASTERIDE 5 MG TABLET: 5 | 30 days supply | Qty: 30 | Fill #3

## 2018-02-02 MED FILL — DOXYCYCLINE HYCLATE 100 MG: 100 | 10 days supply | Qty: 20 | Fill #0

## 2018-02-02 MED FILL — TAMSULOSIN HCL 0.4 MG CAP: 0.4 | 30 days supply | Qty: 30 | Fill #3

## 2018-02-23 MED FILL — CYCLOBENZAPRINE HCL 5 MG TA: 5 | 90 days supply | Qty: 90 | Fill #0

## 2018-03-06 MED FILL — TAMSULOSIN HCL 0.4 MG CAP: 0.4 | 30 days supply | Qty: 30 | Fill #4

## 2018-03-06 MED FILL — FINASTERIDE 5 MG TABLET: 5 | 30 days supply | Qty: 30 | Fill #4

## 2018-03-12 MED FILL — OLOPATADINE HCL 0.2% EYE DR: 0.2 | 75 days supply | Qty: 8 | Fill #0

## 2018-03-23 MED FILL — METOPROLOL SUCCINATE ER 50: 50 | 90 days supply | Qty: 135 | Fill #1

## 2018-04-04 MED FILL — FINASTERIDE 5 MG TABLET: 5 | 30 days supply | Qty: 30 | Fill #5

## 2018-04-04 MED FILL — TAMSULOSIN HCL 0.4 MG CAP: 0.4 | 30 days supply | Qty: 30 | Fill #5

## 2018-04-23 MED FILL — TAMSULOSIN HCL 0.4 MG CAP: 0.4 | 30 days supply | Qty: 30 | Fill #6

## 2018-04-23 MED FILL — FINASTERIDE 5 MG TABLET: 5 | 30 days supply | Qty: 30 | Fill #6

## 2018-05-06 MED FILL — MELOXICAM 7.5 MG TABLET: 7.5 | 90 days supply | Qty: 90 | Fill #1

## 2018-05-21 MED FILL — CYCLOBENZAPRINE HCL 5 MG TA: 5 | 90 days supply | Qty: 90 | Fill #1

## 2018-05-28 DIAGNOSIS — G4733 Obstructive sleep apnea (adult) (pediatric): Secondary | ICD-10-CM | POA: Diagnosis not present

## 2018-06-03 MED FILL — FINASTERIDE 5 MG TABLET: 5 | 30 days supply | Qty: 30 | Fill #7

## 2018-06-03 MED FILL — TAMSULOSIN HCL 0.4 MG CAP: 0.4 | 30 days supply | Qty: 30 | Fill #7

## 2018-06-24 MED FILL — METOPROLOL SUCCINATE ER 50: 50 | 90 days supply | Qty: 135 | Fill #2

## 2018-07-02 MED FILL — FINASTERIDE 5 MG TABLET: 5 | 30 days supply | Qty: 30 | Fill #8

## 2018-07-02 MED FILL — TAMSULOSIN HCL 0.4 MG CAP: 0.4 | 30 days supply | Qty: 30 | Fill #8

## 2018-07-29 MED FILL — FINASTERIDE 5 MG TABLET: 5 | 30 days supply | Qty: 30 | Fill #9

## 2018-07-29 MED FILL — TAMSULOSIN HCL 0.4 MG CAP: 0.4 | 30 days supply | Qty: 30 | Fill #9

## 2018-08-17 MED FILL — MELOXICAM 7.5 MG TABLET: 7.5 | 90 days supply | Qty: 90 | Fill #2

## 2018-08-17 MED FILL — CYCLOBENZAPRINE HCL 5 MG TA: 5 | 90 days supply | Qty: 90 | Fill #2

## 2018-08-31 MED FILL — FINASTERIDE 5 MG TABLET: 5 | 90 days supply | Qty: 90 | Fill #0

## 2018-08-31 MED FILL — TAMSULOSIN HCL 0.4 MG CAP: 0.4 | 90 days supply | Qty: 90 | Fill #0

## 2018-09-14 MED FILL — METOPROLOL SUCCINATE ER 50: 50 | 90 days supply | Qty: 135 | Fill #3

## 2018-10-20 MED FILL — DRYSOL DAB-O-MATIC SOLUTION: 20 | 20 days supply | Qty: 35 | Fill #0

## 2018-11-19 MED FILL — MELOXICAM 7.5 MG TABLET: 7.5 | 90 days supply | Qty: 90 | Fill #3

## 2018-11-19 MED FILL — CYCLOBENZAPRINE HCL 5 MG TA: 5 | 90 days supply | Qty: 90 | Fill #3

## 2018-11-19 MED FILL — TAMSULOSIN HCL 0.4 MG CAP: 0.4 | 90 days supply | Qty: 90 | Fill #1

## 2018-12-02 MED FILL — FINASTERIDE 5 MG TABLET: 5 | 90 days supply | Qty: 90 | Fill #1

## 2018-12-14 MED FILL — METOPROLOL SUCCINATE ER 50: 50 | 90 days supply | Qty: 135 | Fill #0

## 2019-01-13 DIAGNOSIS — H524 Presbyopia: Secondary | ICD-10-CM | POA: Diagnosis not present

## 2019-01-25 DIAGNOSIS — Z125 Encounter for screening for malignant neoplasm of prostate: Secondary | ICD-10-CM | POA: Diagnosis not present

## 2019-01-25 DIAGNOSIS — I10 Essential (primary) hypertension: Secondary | ICD-10-CM | POA: Diagnosis not present

## 2019-01-25 DIAGNOSIS — N400 Enlarged prostate without lower urinary tract symptoms: Secondary | ICD-10-CM | POA: Diagnosis not present

## 2019-01-25 DIAGNOSIS — Z0000 Encounter for general adult medical examination without abnormal findings: Secondary | ICD-10-CM | POA: Diagnosis not present

## 2019-02-22 MED FILL — MELOXICAM 7.5 MG TABLET: 7.5 | 90 days supply | Qty: 90 | Fill #0

## 2019-02-22 MED FILL — CYCLOBENZAPRINE HCL 5 MG TA: 5 | 90 days supply | Qty: 90 | Fill #0

## 2019-02-22 MED FILL — FINASTERIDE 5 MG TABLET: 5 | 90 days supply | Qty: 90 | Fill #0

## 2019-02-22 MED FILL — TAMSULOSIN HCL 0.4 MG CAP: 0.4 | 90 days supply | Qty: 90 | Fill #0

## 2019-03-02 MED FILL — METOPROLOL SUCCINATE ER 50: 50 | 90 days supply | Qty: 135 | Fill #0

## 2019-05-18 ENCOUNTER — Other Ambulatory Visit (HOSPITAL_COMMUNITY): Payer: Self-pay | Admitting: Internal Medicine

## 2019-05-18 DIAGNOSIS — G4733 Obstructive sleep apnea (adult) (pediatric): Secondary | ICD-10-CM | POA: Diagnosis not present

## 2019-05-18 DIAGNOSIS — I10 Essential (primary) hypertension: Secondary | ICD-10-CM | POA: Diagnosis not present

## 2019-05-18 DIAGNOSIS — J301 Allergic rhinitis due to pollen: Secondary | ICD-10-CM | POA: Diagnosis not present

## 2019-05-18 DIAGNOSIS — Z0000 Encounter for general adult medical examination without abnormal findings: Secondary | ICD-10-CM | POA: Diagnosis not present

## 2019-05-18 MED FILL — OLOPATADINE HCL 0.2 % SOLN: 0.2 | 25 days supply | Qty: 3 | Fill #0

## 2019-05-18 MED FILL — FINASTERIDE 5 MG TABLET: 5 | 90 days supply | Qty: 90 | Fill #0

## 2019-05-18 MED FILL — HYDROCHLOROTHIAZIDE 12.5 MG: 12.5 | 90 days supply | Qty: 90 | Fill #0

## 2019-05-18 MED FILL — CYCLOBENZAPRINE HCL 5 MG TA: 5 | 90 days supply | Qty: 90 | Fill #0

## 2019-05-18 MED FILL — MELOXICAM 7.5 MG TABLET: 7.5 | 90 days supply | Qty: 90 | Fill #0

## 2019-05-18 MED FILL — TAMSULOSIN HCL 0.4 MG CAP: 0.4 | 90 days supply | Qty: 90 | Fill #0

## 2019-05-18 MED FILL — DRYSOL DAB-O-MATIC SOLUTION: 20 | 30 days supply | Qty: 35 | Fill #0

## 2019-06-12 MED FILL — METOPROLOL SUCCINATE ER 50: 50 | 90 days supply | Qty: 135 | Fill #0

## 2019-07-21 MED FILL — OLOPATADINE HCL 0.2 % SOLN: 0.2 | 25 days supply | Qty: 3 | Fill #1

## 2019-08-05 MED FILL — HYDROCHLOROTHIAZIDE 12.5 MG: 12.5 | 90 days supply | Qty: 90 | Fill #1

## 2019-08-24 MED FILL — CYCLOBENZAPRINE HCL 5 MG TA: 5 | 90 days supply | Qty: 90 | Fill #1

## 2019-08-28 MED FILL — FINASTERIDE 5 MG TABLET: 5 | 90 days supply | Qty: 90 | Fill #1

## 2019-08-28 MED FILL — TAMSULOSIN HCL 0.4 MG CAP: 0.4 | 90 days supply | Qty: 90 | Fill #1

## 2019-08-30 MED FILL — METOPROLOL SUCCINATE ER 50: 50 | 90 days supply | Qty: 135 | Fill #1

## 2019-10-06 ENCOUNTER — Other Ambulatory Visit (HOSPITAL_COMMUNITY): Payer: Self-pay | Admitting: Internal Medicine

## 2019-10-06 DIAGNOSIS — G4733 Obstructive sleep apnea (adult) (pediatric): Secondary | ICD-10-CM | POA: Diagnosis not present

## 2019-10-06 DIAGNOSIS — G8929 Other chronic pain: Secondary | ICD-10-CM | POA: Diagnosis not present

## 2019-10-06 DIAGNOSIS — I10 Essential (primary) hypertension: Secondary | ICD-10-CM | POA: Diagnosis not present

## 2019-10-06 DIAGNOSIS — M546 Pain in thoracic spine: Secondary | ICD-10-CM | POA: Diagnosis not present

## 2019-10-06 DIAGNOSIS — J301 Allergic rhinitis due to pollen: Secondary | ICD-10-CM | POA: Diagnosis not present

## 2019-11-15 DIAGNOSIS — D2262 Melanocytic nevi of left upper limb, including shoulder: Secondary | ICD-10-CM | POA: Diagnosis not present

## 2019-11-15 DIAGNOSIS — L821 Other seborrheic keratosis: Secondary | ICD-10-CM | POA: Diagnosis not present

## 2019-11-15 DIAGNOSIS — D2271 Melanocytic nevi of right lower limb, including hip: Secondary | ICD-10-CM | POA: Diagnosis not present

## 2019-11-15 DIAGNOSIS — D225 Melanocytic nevi of trunk: Secondary | ICD-10-CM | POA: Diagnosis not present

## 2019-11-15 DIAGNOSIS — D2272 Melanocytic nevi of left lower limb, including hip: Secondary | ICD-10-CM | POA: Diagnosis not present

## 2019-11-15 DIAGNOSIS — D2261 Melanocytic nevi of right upper limb, including shoulder: Secondary | ICD-10-CM | POA: Diagnosis not present

## 2019-11-15 MED FILL — TAMSULOSIN HCL 0.4 MG CAP: 0.4 | 90 days supply | Qty: 90 | Fill #2

## 2019-11-15 MED FILL — CYCLOBENZAPRINE HCL 5 MG TA: 5 | 90 days supply | Qty: 90 | Fill #2

## 2019-11-15 MED FILL — FINASTERIDE 5 MG TABLET: 5 | 90 days supply | Qty: 90 | Fill #2

## 2019-11-18 MED FILL — METOPROLOL SUCCINATE ER 50: 50 | 90 days supply | Qty: 135 | Fill #2

## 2019-11-23 ENCOUNTER — Ambulatory Visit: Payer: 59 | Attending: Internal Medicine

## 2019-11-23 DIAGNOSIS — Z23 Encounter for immunization: Secondary | ICD-10-CM

## 2019-11-23 NOTE — Progress Notes (Signed)
° °  Covid-19 Vaccination Clinic  Name:  Caleb Li    MRN: [id number] DOB: September 19, 1955  11/23/2019  [name] was observed post Covid-19 immunization for 15 minutes without incident. He was provided with Vaccine Information Sheet and instruction to access the V-Safe system.   [name] was instructed to call 911 with any severe reactions post vaccine:  Difficulty breathing   Swelling of face and throat   A fast heartbeat   A bad rash all over body   Dizziness and weakness

## 2019-11-24 ENCOUNTER — Other Ambulatory Visit: Payer: Self-pay | Admitting: Internal Medicine

## 2019-12-10 ENCOUNTER — Other Ambulatory Visit: Payer: Self-pay | Admitting: Internal Medicine

## 2019-12-20 MED FILL — LOSARTAN POTASSIUM 25 MG TA: 25 | 90 days supply | Qty: 90 | Fill #0

## 2020-02-25 MED FILL — CYCLOBENZAPRINE HCL 5 MG TA: 5 | 90 days supply | Qty: 90 | Fill #3

## 2020-02-25 MED FILL — METOPROLOL SUCCINATE ER 50: 50 | 90 days supply | Qty: 135 | Fill #3

## 2020-02-25 MED FILL — TAMSULOSIN HCL 0.4 MG CAP: 0.4 | 90 days supply | Qty: 90 | Fill #3

## 2020-02-25 MED FILL — DRYSOL DAB-O-MATIC SOLUTION: 20 | 30 days supply | Qty: 35 | Fill #1

## 2020-02-25 MED FILL — FINASTERIDE 5 MG TABLET: 5 | 90 days supply | Qty: 90 | Fill #3

## 2020-03-11 MED FILL — LOSARTAN POTASSIUM 25 MG TA: 25 | 30 days supply | Qty: 30 | Fill #1

## 2020-04-24 ENCOUNTER — Other Ambulatory Visit (HOSPITAL_BASED_OUTPATIENT_CLINIC_OR_DEPARTMENT_OTHER): Payer: Self-pay

## 2020-05-11 DIAGNOSIS — G8929 Other chronic pain: Secondary | ICD-10-CM | POA: Diagnosis not present

## 2020-05-11 DIAGNOSIS — Z6837 Body mass index (BMI) 37.0-37.9, adult: Secondary | ICD-10-CM | POA: Diagnosis not present

## 2020-05-11 DIAGNOSIS — Z1159 Encounter for screening for other viral diseases: Secondary | ICD-10-CM | POA: Diagnosis not present

## 2020-05-11 DIAGNOSIS — I10 Essential (primary) hypertension: Secondary | ICD-10-CM | POA: Diagnosis not present

## 2020-05-11 DIAGNOSIS — Z114 Encounter for screening for human immunodeficiency virus [HIV]: Secondary | ICD-10-CM | POA: Diagnosis not present

## 2020-05-11 DIAGNOSIS — Z0000 Encounter for general adult medical examination without abnormal findings: Secondary | ICD-10-CM | POA: Diagnosis not present

## 2020-05-11 DIAGNOSIS — M546 Pain in thoracic spine: Secondary | ICD-10-CM | POA: Diagnosis not present

## 2020-05-11 DIAGNOSIS — Z125 Encounter for screening for malignant neoplasm of prostate: Secondary | ICD-10-CM | POA: Diagnosis not present

## 2020-05-18 ENCOUNTER — Other Ambulatory Visit (HOSPITAL_COMMUNITY): Payer: Self-pay

## 2020-05-18 DIAGNOSIS — G4733 Obstructive sleep apnea (adult) (pediatric): Secondary | ICD-10-CM | POA: Diagnosis not present

## 2020-05-18 DIAGNOSIS — Z0000 Encounter for general adult medical examination without abnormal findings: Secondary | ICD-10-CM | POA: Diagnosis not present

## 2020-05-18 DIAGNOSIS — Z23 Encounter for immunization: Secondary | ICD-10-CM | POA: Diagnosis not present

## 2020-05-18 DIAGNOSIS — Z1331 Encounter for screening for depression: Secondary | ICD-10-CM | POA: Diagnosis not present

## 2020-05-18 DIAGNOSIS — M546 Pain in thoracic spine: Secondary | ICD-10-CM | POA: Diagnosis not present

## 2020-05-18 DIAGNOSIS — I10 Essential (primary) hypertension: Secondary | ICD-10-CM | POA: Diagnosis not present

## 2020-05-18 MED ORDER — TAMSULOSIN HCL 0.4 MG PO CAPS
ORAL_CAPSULE | ORAL | 3 refills | Status: DC
  Filled 2020-05-18 – 2020-06-14 (×2): qty 90, 90d supply, fill #0
  Filled 2020-08-31: qty 90, 90d supply, fill #1
  Filled 2020-12-03: qty 90, 90d supply, fill #2
  Filled 2021-03-07: qty 90, 90d supply, fill #3

## 2020-05-18 MED ORDER — FINASTERIDE 5 MG PO TABS
ORAL_TABLET | ORAL | 3 refills | Status: AC
  Filled 2020-05-18: qty 90, 90d supply, fill #0

## 2020-05-18 MED ORDER — CYCLOBENZAPRINE HCL 5 MG PO TABS
ORAL_TABLET | ORAL | 3 refills | Status: AC
  Filled 2020-05-18: qty 90, 90d supply, fill #0

## 2020-05-18 MED ORDER — LOSARTAN POTASSIUM 25 MG PO TABS
25.0000 mg | ORAL_TABLET | Freq: Every day | ORAL | 3 refills | Status: AC
  Filled 2020-05-18: qty 90, 90d supply, fill #0

## 2020-05-18 MED ORDER — METOPROLOL SUCCINATE ER 50 MG PO TB24
75.0000 mg | ORAL_TABLET | Freq: Every day | ORAL | 3 refills | Status: DC
  Filled 2020-05-18 – 2020-06-12 (×2): qty 135, 90d supply, fill #0
  Filled 2020-08-31: qty 135, 90d supply, fill #1
  Filled 2020-12-03: qty 135, 90d supply, fill #2
  Filled 2021-03-07: qty 135, 90d supply, fill #3

## 2020-05-28 ENCOUNTER — Other Ambulatory Visit (HOSPITAL_COMMUNITY): Payer: Self-pay

## 2020-05-28 MED FILL — Losartan Potassium Tab 25 MG: ORAL | 30 days supply | Qty: 30 | Fill #0 | Status: AC

## 2020-05-29 ENCOUNTER — Other Ambulatory Visit (HOSPITAL_COMMUNITY): Payer: Self-pay

## 2020-05-29 MED ORDER — CYCLOBENZAPRINE HCL 5 MG PO TABS
ORAL_TABLET | ORAL | 3 refills | Status: DC
  Filled 2020-05-29: qty 90, 90d supply, fill #0
  Filled 2020-08-23: qty 90, 90d supply, fill #1
  Filled 2020-11-19: qty 90, 90d supply, fill #2
  Filled 2021-03-22: qty 90, 90d supply, fill #3

## 2020-05-29 MED ORDER — FINASTERIDE 5 MG PO TABS
ORAL_TABLET | ORAL | 3 refills | Status: DC
  Filled 2020-05-29: qty 90, 90d supply, fill #0
  Filled 2020-08-23: qty 90, 90d supply, fill #1
  Filled 2020-08-31 – 2020-11-19 (×2): qty 90, 90d supply, fill #2
  Filled 2021-03-07: qty 90, 90d supply, fill #3

## 2020-05-31 ENCOUNTER — Other Ambulatory Visit (HOSPITAL_COMMUNITY): Payer: Self-pay

## 2020-06-01 ENCOUNTER — Other Ambulatory Visit (HOSPITAL_COMMUNITY): Payer: Self-pay

## 2020-06-02 ENCOUNTER — Other Ambulatory Visit (HOSPITAL_COMMUNITY): Payer: Self-pay

## 2020-06-08 ENCOUNTER — Other Ambulatory Visit (HOSPITAL_COMMUNITY): Payer: Self-pay

## 2020-06-09 ENCOUNTER — Other Ambulatory Visit (HOSPITAL_COMMUNITY): Payer: Self-pay

## 2020-06-10 DIAGNOSIS — G4733 Obstructive sleep apnea (adult) (pediatric): Secondary | ICD-10-CM | POA: Diagnosis not present

## 2020-06-12 ENCOUNTER — Other Ambulatory Visit (HOSPITAL_COMMUNITY): Payer: Self-pay

## 2020-06-12 DIAGNOSIS — G4733 Obstructive sleep apnea (adult) (pediatric): Secondary | ICD-10-CM | POA: Diagnosis not present

## 2020-06-14 ENCOUNTER — Other Ambulatory Visit (HOSPITAL_COMMUNITY): Payer: Self-pay

## 2020-06-15 ENCOUNTER — Other Ambulatory Visit (HOSPITAL_COMMUNITY): Payer: Self-pay

## 2020-06-21 ENCOUNTER — Other Ambulatory Visit (HOSPITAL_COMMUNITY): Payer: Self-pay

## 2020-06-22 ENCOUNTER — Other Ambulatory Visit: Payer: Self-pay

## 2020-06-22 ENCOUNTER — Other Ambulatory Visit (HOSPITAL_COMMUNITY): Payer: Self-pay

## 2020-06-22 MED ORDER — LOSARTAN POTASSIUM 25 MG PO TABS
25.0000 mg | ORAL_TABLET | Freq: Every day | ORAL | 3 refills | Status: DC
  Filled 2020-06-22 – 2020-06-28 (×2): qty 90, 90d supply, fill #0
  Filled 2020-09-18: qty 90, 90d supply, fill #1
  Filled 2020-12-03: qty 90, 90d supply, fill #2
  Filled 2021-03-07: qty 90, 90d supply, fill #3

## 2020-06-26 ENCOUNTER — Other Ambulatory Visit (HOSPITAL_COMMUNITY): Payer: Self-pay

## 2020-06-28 ENCOUNTER — Other Ambulatory Visit (HOSPITAL_COMMUNITY): Payer: Self-pay

## 2020-06-28 ENCOUNTER — Other Ambulatory Visit: Payer: Self-pay

## 2020-07-11 DIAGNOSIS — G4733 Obstructive sleep apnea (adult) (pediatric): Secondary | ICD-10-CM | POA: Diagnosis not present

## 2020-08-10 DIAGNOSIS — G4733 Obstructive sleep apnea (adult) (pediatric): Secondary | ICD-10-CM | POA: Diagnosis not present

## 2020-08-23 ENCOUNTER — Other Ambulatory Visit (HOSPITAL_COMMUNITY): Payer: Self-pay

## 2020-08-31 ENCOUNTER — Other Ambulatory Visit (HOSPITAL_COMMUNITY): Payer: Self-pay

## 2020-09-10 DIAGNOSIS — G4733 Obstructive sleep apnea (adult) (pediatric): Secondary | ICD-10-CM | POA: Diagnosis not present

## 2020-09-18 ENCOUNTER — Other Ambulatory Visit (HOSPITAL_COMMUNITY): Payer: Self-pay

## 2020-09-26 ENCOUNTER — Other Ambulatory Visit: Payer: Self-pay

## 2020-09-26 ENCOUNTER — Other Ambulatory Visit (HOSPITAL_COMMUNITY): Payer: Self-pay

## 2020-09-26 DIAGNOSIS — G4733 Obstructive sleep apnea (adult) (pediatric): Secondary | ICD-10-CM | POA: Diagnosis not present

## 2020-09-26 DIAGNOSIS — M25562 Pain in left knee: Secondary | ICD-10-CM | POA: Diagnosis not present

## 2020-09-26 DIAGNOSIS — I10 Essential (primary) hypertension: Secondary | ICD-10-CM | POA: Diagnosis not present

## 2020-09-26 DIAGNOSIS — J301 Allergic rhinitis due to pollen: Secondary | ICD-10-CM | POA: Diagnosis not present

## 2020-09-26 MED ORDER — CELECOXIB 200 MG PO CAPS
ORAL_CAPSULE | ORAL | 2 refills | Status: DC
  Filled 2020-09-26: qty 60, 30d supply, fill #0
  Filled 2020-10-23: qty 60, 30d supply, fill #1
  Filled 2020-11-19: qty 60, 30d supply, fill #2

## 2020-09-27 DIAGNOSIS — H524 Presbyopia: Secondary | ICD-10-CM | POA: Diagnosis not present

## 2020-10-23 ENCOUNTER — Other Ambulatory Visit (HOSPITAL_COMMUNITY): Payer: Self-pay

## 2020-11-20 ENCOUNTER — Other Ambulatory Visit (HOSPITAL_COMMUNITY): Payer: Self-pay

## 2020-12-04 ENCOUNTER — Other Ambulatory Visit (HOSPITAL_COMMUNITY): Payer: Self-pay

## 2020-12-15 ENCOUNTER — Ambulatory Visit: Payer: 59 | Attending: Internal Medicine

## 2020-12-15 ENCOUNTER — Other Ambulatory Visit: Payer: Self-pay

## 2020-12-15 DIAGNOSIS — Z23 Encounter for immunization: Secondary | ICD-10-CM

## 2020-12-15 MED ORDER — PFIZER COVID-19 VAC BIVALENT 30 MCG/0.3ML IM SUSP
INTRAMUSCULAR | 0 refills | Status: AC
  Filled 2020-12-15: qty 0.3, 1d supply, fill #0

## 2020-12-15 NOTE — Progress Notes (Signed)
   Covid-19 Vaccination Clinic  Name:  RANEY KOEPPEN    MRN: [id number] DOB: 19-Apr-1955  12/15/2020  Mr. Pallone was observed post Covid-19 immunization for 15 minutes without incident. He was provided with Vaccine Information Sheet and instruction to access the V-Safe system.   Mr. Horn was instructed to call 911 with any severe reactions post vaccine: Difficulty breathing  Swelling of face and throat  A fast heartbeat  A bad rash all over body  Dizziness and weakness   Immunizations Administered     Name Date Dose VIS Date Route   Pfizer Covid-19 Vaccine Bivalent Booster 12/15/2020 10:50 AM 0.3 mL 10/04/2020 Intramuscular   Manufacturer: [place]   Lot: [id number]   [place]: [place], PharmD, MBA Clinical Acute Care Pharmacist

## 2020-12-16 ENCOUNTER — Other Ambulatory Visit (HOSPITAL_COMMUNITY): Payer: Self-pay

## 2020-12-18 ENCOUNTER — Other Ambulatory Visit (HOSPITAL_COMMUNITY): Payer: Self-pay

## 2020-12-18 MED ORDER — CELECOXIB 200 MG PO CAPS
ORAL_CAPSULE | ORAL | 2 refills | Status: AC
  Filled 2020-12-18: qty 60, 30d supply, fill #0

## 2021-03-07 ENCOUNTER — Other Ambulatory Visit (HOSPITAL_COMMUNITY): Payer: Self-pay

## 2021-03-08 ENCOUNTER — Other Ambulatory Visit (HOSPITAL_COMMUNITY): Payer: Self-pay

## 2021-03-22 ENCOUNTER — Other Ambulatory Visit (HOSPITAL_COMMUNITY): Payer: Self-pay

## 2021-05-09 DIAGNOSIS — Z1211 Encounter for screening for malignant neoplasm of colon: Secondary | ICD-10-CM | POA: Diagnosis not present

## 2021-05-15 DIAGNOSIS — Z0000 Encounter for general adult medical examination without abnormal findings: Secondary | ICD-10-CM | POA: Diagnosis not present

## 2021-05-15 DIAGNOSIS — Z125 Encounter for screening for malignant neoplasm of prostate: Secondary | ICD-10-CM | POA: Diagnosis not present

## 2021-05-15 DIAGNOSIS — I10 Essential (primary) hypertension: Secondary | ICD-10-CM | POA: Diagnosis not present

## 2021-05-15 DIAGNOSIS — G4733 Obstructive sleep apnea (adult) (pediatric): Secondary | ICD-10-CM | POA: Diagnosis not present

## 2021-05-15 DIAGNOSIS — Z131 Encounter for screening for diabetes mellitus: Secondary | ICD-10-CM | POA: Diagnosis not present

## 2021-05-15 DIAGNOSIS — M546 Pain in thoracic spine: Secondary | ICD-10-CM | POA: Diagnosis not present

## 2021-05-22 ENCOUNTER — Other Ambulatory Visit: Payer: Self-pay

## 2021-05-22 ENCOUNTER — Other Ambulatory Visit (HOSPITAL_COMMUNITY): Payer: Self-pay

## 2021-05-22 DIAGNOSIS — Z0000 Encounter for general adult medical examination without abnormal findings: Secondary | ICD-10-CM | POA: Diagnosis not present

## 2021-05-22 DIAGNOSIS — Z6841 Body Mass Index (BMI) 40.0 and over, adult: Secondary | ICD-10-CM | POA: Diagnosis not present

## 2021-05-22 DIAGNOSIS — Z1331 Encounter for screening for depression: Secondary | ICD-10-CM | POA: Diagnosis not present

## 2021-05-22 DIAGNOSIS — G4733 Obstructive sleep apnea (adult) (pediatric): Secondary | ICD-10-CM | POA: Diagnosis not present

## 2021-05-22 DIAGNOSIS — I10 Essential (primary) hypertension: Secondary | ICD-10-CM | POA: Diagnosis not present

## 2021-05-22 DIAGNOSIS — G8929 Other chronic pain: Secondary | ICD-10-CM | POA: Diagnosis not present

## 2021-05-22 DIAGNOSIS — J302 Other seasonal allergic rhinitis: Secondary | ICD-10-CM | POA: Diagnosis not present

## 2021-05-22 DIAGNOSIS — M25562 Pain in left knee: Secondary | ICD-10-CM | POA: Diagnosis not present

## 2021-05-22 MED ORDER — LOSARTAN POTASSIUM 25 MG PO TABS
25.0000 mg | ORAL_TABLET | Freq: Every day | ORAL | 3 refills | Status: DC
  Filled 2021-05-22 – 2021-06-08 (×3): qty 90, 90d supply, fill #0
  Filled 2021-08-24: qty 90, 90d supply, fill #1
  Filled 2021-12-08: qty 90, 90d supply, fill #2
  Filled 2022-03-01: qty 90, 90d supply, fill #3

## 2021-05-22 MED ORDER — METOPROLOL SUCCINATE ER 50 MG PO TB24
ORAL_TABLET | ORAL | 3 refills | Status: DC
  Filled 2021-05-22 – 2021-06-08 (×3): qty 135, 90d supply, fill #0
  Filled 2021-08-24: qty 135, 90d supply, fill #1
  Filled 2021-11-24: qty 135, 90d supply, fill #2
  Filled 2022-03-01: qty 135, 90d supply, fill #3

## 2021-05-22 MED ORDER — CYCLOBENZAPRINE HCL 5 MG PO TABS
ORAL_TABLET | ORAL | 3 refills | Status: DC
  Filled 2021-05-22 – 2021-06-08 (×3): qty 90, 90d supply, fill #0
  Filled 2021-08-24: qty 90, 90d supply, fill #1
  Filled 2021-12-08: qty 90, 90d supply, fill #2
  Filled 2022-03-07: qty 90, 90d supply, fill #3

## 2021-05-22 MED ORDER — DRYSOL 20 % EX SOLN
CUTANEOUS | 5 refills | Status: DC
  Filled 2021-05-22: qty 35, 30d supply, fill #0
  Filled 2021-05-22: qty 35, 20d supply, fill #0
  Filled 2021-11-30: qty 35, 30d supply, fill #0

## 2021-05-22 MED ORDER — FINASTERIDE 5 MG PO TABS
ORAL_TABLET | ORAL | 3 refills | Status: DC
  Filled 2021-05-22 – 2021-05-31 (×3): qty 90, 90d supply, fill #0
  Filled 2021-08-24: qty 90, 90d supply, fill #1
  Filled 2021-11-24: qty 90, 90d supply, fill #2
  Filled 2022-03-01: qty 90, 90d supply, fill #3

## 2021-05-22 MED ORDER — TAMSULOSIN HCL 0.4 MG PO CAPS
ORAL_CAPSULE | ORAL | 3 refills | Status: DC
  Filled 2021-05-22 – 2021-05-31 (×3): qty 90, 90d supply, fill #0
  Filled 2021-08-24: qty 90, 90d supply, fill #1
  Filled 2021-12-08: qty 90, 90d supply, fill #2
  Filled 2022-03-01: qty 90, 90d supply, fill #3

## 2021-05-31 ENCOUNTER — Other Ambulatory Visit (HOSPITAL_COMMUNITY): Payer: Self-pay

## 2021-06-08 ENCOUNTER — Other Ambulatory Visit (HOSPITAL_COMMUNITY): Payer: Self-pay

## 2021-07-30 ENCOUNTER — Encounter: Payer: Self-pay | Admitting: *Deleted

## 2021-07-31 ENCOUNTER — Encounter
Admission: RE | Disposition: A | Discharge status: Home / Self Care | Payer: Self-pay | Source: Home / Self Care | Attending: Gastroenterology

## 2021-07-31 ENCOUNTER — Encounter: Payer: Self-pay | Admitting: *Deleted

## 2021-07-31 ENCOUNTER — Ambulatory Visit: Payer: 59 | Admitting: Anesthesiology

## 2021-07-31 ENCOUNTER — Ambulatory Visit
Admission: RE | Admit: 2021-07-31 | Discharge: 2021-07-31 | Disposition: A | Discharge status: Home / Self Care | Payer: 59 | Attending: Gastroenterology | Admitting: Gastroenterology

## 2021-07-31 DIAGNOSIS — K641 Second degree hemorrhoids: Secondary | ICD-10-CM | POA: Diagnosis not present

## 2021-07-31 DIAGNOSIS — K5730 Diverticulosis of large intestine without perforation or abscess without bleeding: Secondary | ICD-10-CM | POA: Diagnosis not present

## 2021-07-31 DIAGNOSIS — E669 Obesity, unspecified: Secondary | ICD-10-CM | POA: Insufficient documentation

## 2021-07-31 DIAGNOSIS — G4730 Sleep apnea, unspecified: Secondary | ICD-10-CM | POA: Insufficient documentation

## 2021-07-31 DIAGNOSIS — D123 Benign neoplasm of transverse colon: Secondary | ICD-10-CM | POA: Diagnosis not present

## 2021-07-31 DIAGNOSIS — Z6839 Body mass index (BMI) 39.0-39.9, adult: Secondary | ICD-10-CM | POA: Diagnosis not present

## 2021-07-31 DIAGNOSIS — I10 Essential (primary) hypertension: Secondary | ICD-10-CM | POA: Diagnosis not present

## 2021-07-31 DIAGNOSIS — K635 Polyp of colon: Secondary | ICD-10-CM | POA: Diagnosis not present

## 2021-07-31 DIAGNOSIS — Z800 Family history of malignant neoplasm of digestive organs: Secondary | ICD-10-CM | POA: Diagnosis not present

## 2021-07-31 DIAGNOSIS — D122 Benign neoplasm of ascending colon: Secondary | ICD-10-CM | POA: Diagnosis not present

## 2021-07-31 DIAGNOSIS — Z1211 Encounter for screening for malignant neoplasm of colon: Secondary | ICD-10-CM | POA: Insufficient documentation

## 2021-07-31 HISTORY — PX: COLONOSCOPY WITH PROPOFOL: SHX5780

## 2021-07-31 HISTORY — DX: Benign prostatic hyperplasia without lower urinary tract symptoms: N40.0

## 2021-07-31 SURGERY — COLONOSCOPY WITH PROPOFOL
Anesthesia: General

## 2021-07-31 MED ORDER — SODIUM CHLORIDE 0.9 % IV SOLN: INTRAVENOUS | Status: DC

## 2021-07-31 MED ORDER — PROPOFOL 10 MG/ML IV BOLUS
INTRAVENOUS | Status: DC | PRN
  Administered 2021-07-31: 100 mg via INTRAVENOUS
  Administered 2021-07-31: 120 ug/kg/min via INTRAVENOUS

## 2021-07-31 MED ORDER — PROPOFOL 10 MG/ML IV BOLUS
INTRAVENOUS | Status: AC
  Filled 2021-07-31: qty 40

## 2021-07-31 MED ORDER — LIDOCAINE HCL (PF) 2 % IJ SOLN
INTRAMUSCULAR | Status: AC
  Filled 2021-07-31: qty 5

## 2021-07-31 MED ORDER — LIDOCAINE HCL (CARDIAC) PF 100 MG/5ML IV SOSY
PREFILLED_SYRINGE | INTRAVENOUS | Status: DC | PRN
  Administered 2021-07-31: 100 mg via INTRAVENOUS

## 2021-07-31 NOTE — Transfer of Care (Signed)
Immediate Anesthesia Transfer of Care Note  Patient: Caleb Li  Procedure(s) Performed: COLONOSCOPY WITH PROPOFOL  Patient Location: PACU  Anesthesia Type:General  Level of Consciousness: drowsy  Airway & Oxygen Therapy: Patient Spontanous Breathing and Patient connected to face mask oxygen  Post-op Assessment: Report given to RN and Post -op Vital signs reviewed and stable  Post vital signs: Reviewed and stable  Last Vitals:  Vitals Value Taken Time  BP 101/53 07/31/21 0812  Temp 35.8 C 07/31/21 0812  Pulse 74 07/31/21 0812  Resp 15 07/31/21 0812  SpO2 97 % 07/31/21 0812  Vitals shown include unvalidated device data.  Last Pain:  Vitals:   07/31/21 0812  TempSrc: Tympanic  PainSc: Asleep         Complications: No notable events documented.

## 2021-07-31 NOTE — Anesthesia Postprocedure Evaluation (Signed)
Anesthesia Post Note  Patient: Caleb Li  Procedure(s) Performed: COLONOSCOPY WITH PROPOFOL  Patient location during evaluation: Endoscopy Anesthesia Type: General Level of consciousness: awake and alert Pain management: pain level controlled Vital Signs Assessment: post-procedure vital signs reviewed and stable Respiratory status: spontaneous breathing, nonlabored ventilation and respiratory function stable Cardiovascular status: blood pressure returned to baseline and stable Postop Assessment: no apparent nausea or vomiting Anesthetic complications: no   No notable events documented.   Last Vitals:  Vitals:   07/31/21 0822 07/31/21 0832  BP: 108/62 117/61  Pulse: 66 67  Resp: 14 15  Temp:    SpO2: 97% 97%    Last Pain:  Vitals:   07/31/21 0832  TempSrc:   PainSc: 0-No pain                 Foye Deer

## 2021-07-31 NOTE — Anesthesia Preprocedure Evaluation (Addendum)
Anesthesia Evaluation  Patient identified by MRN, date of birth, ID band Patient awake    Reviewed: Allergy & Precautions, NPO status , Patient's Chart, lab work & pertinent test results  Airway Mallampati: III  TM Distance: >3 FB Neck ROM: full    Dental  (+) Partial Upper   Pulmonary sleep apnea and Continuous Positive Airway Pressure Ventilation ,    Pulmonary exam normal        Cardiovascular Exercise Tolerance: Good hypertension, Normal cardiovascular exam+ Valvular Problems/Murmurs      Neuro/Psych negative neurological ROS  negative psych ROS   GI/Hepatic negative GI ROS, Neg liver ROS,   Endo/Other  negative endocrine ROS  Renal/GU negative Renal ROS  negative genitourinary   Musculoskeletal   Abdominal (+) + obese,   Peds  Hematology negative hematology ROS (+)   Anesthesia Other Findings Past Medical History: No date: BPH (benign prostatic hyperplasia) No date: Heart murmur No date: Hypertension No date: Sleep apnea  Past Surgical History: No date: TONSILLECTOMY No date: VASECTOMY     Reproductive/Obstetrics negative OB ROS                            Anesthesia Physical Anesthesia Plan  ASA: 2  Anesthesia Plan: General   Post-op Pain Management:    Induction:   PONV Risk Score and Plan: Propofol infusion and TIVA  Airway Management Planned: Natural Airway  Additional Equipment:   Intra-op Plan:   Post-operative Plan:   Informed Consent: I have reviewed the patients History and Physical, chart, labs and discussed the procedure including the risks, benefits and alternatives for the proposed anesthesia with the patient or authorized representative who has indicated his/her understanding and acceptance.     Dental Advisory Given  Plan Discussed with: Anesthesiologist, CRNA and Surgeon  Anesthesia Plan Comments:        Anesthesia Quick Evaluation

## 2021-08-01 ENCOUNTER — Encounter: Payer: Self-pay | Admitting: Gastroenterology

## 2021-08-01 LAB — SURGICAL PATHOLOGY

## 2021-08-24 ENCOUNTER — Other Ambulatory Visit (HOSPITAL_COMMUNITY): Payer: Self-pay

## 2021-08-25 ENCOUNTER — Other Ambulatory Visit (HOSPITAL_COMMUNITY): Payer: Self-pay

## 2021-10-23 ENCOUNTER — Other Ambulatory Visit (HOSPITAL_COMMUNITY): Payer: Self-pay

## 2021-10-23 MED ORDER — MELOXICAM 7.5 MG PO TABS
7.5000 mg | ORAL_TABLET | Freq: Every day | ORAL | 1 refills | Status: AC
  Filled 2021-10-23: qty 90, 90d supply, fill #0

## 2021-11-24 ENCOUNTER — Other Ambulatory Visit (HOSPITAL_COMMUNITY): Payer: Self-pay

## 2021-11-26 ENCOUNTER — Other Ambulatory Visit (HOSPITAL_COMMUNITY): Payer: Self-pay

## 2021-11-30 ENCOUNTER — Other Ambulatory Visit (HOSPITAL_COMMUNITY): Payer: Self-pay

## 2021-12-03 ENCOUNTER — Other Ambulatory Visit (HOSPITAL_COMMUNITY): Payer: Self-pay

## 2021-12-08 ENCOUNTER — Other Ambulatory Visit (HOSPITAL_COMMUNITY): Payer: Self-pay

## 2021-12-10 ENCOUNTER — Other Ambulatory Visit (HOSPITAL_COMMUNITY): Payer: Self-pay

## 2021-12-20 DIAGNOSIS — H524 Presbyopia: Secondary | ICD-10-CM | POA: Diagnosis not present

## 2022-01-03 ENCOUNTER — Other Ambulatory Visit (HOSPITAL_COMMUNITY): Payer: Self-pay

## 2022-01-03 MED ORDER — CLOTRIMAZOLE 1 % EX CREA
TOPICAL_CREAM | Freq: Two times a day (BID) | CUTANEOUS | 1 refills | Status: AC
  Filled 2022-01-03: qty 90, 30d supply, fill #0

## 2022-01-08 ENCOUNTER — Other Ambulatory Visit (HOSPITAL_COMMUNITY): Payer: Self-pay

## 2022-01-08 DIAGNOSIS — H6060 Unspecified chronic otitis externa, unspecified ear: Secondary | ICD-10-CM | POA: Diagnosis not present

## 2022-01-08 DIAGNOSIS — H6122 Impacted cerumen, left ear: Secondary | ICD-10-CM | POA: Diagnosis not present

## 2022-01-08 DIAGNOSIS — H903 Sensorineural hearing loss, bilateral: Secondary | ICD-10-CM | POA: Diagnosis not present

## 2022-01-08 DIAGNOSIS — T161XXA Foreign body in right ear, initial encounter: Secondary | ICD-10-CM | POA: Diagnosis not present

## 2022-01-08 MED ORDER — MOMETASONE FUROATE 0.1 % EX SOLN
CUTANEOUS | 3 refills | Status: AC
  Filled 2022-01-08: qty 60, 30d supply, fill #0

## 2022-01-09 ENCOUNTER — Other Ambulatory Visit (HOSPITAL_COMMUNITY): Payer: Self-pay

## 2022-01-18 DIAGNOSIS — G4733 Obstructive sleep apnea (adult) (pediatric): Secondary | ICD-10-CM | POA: Diagnosis not present

## 2022-03-02 ENCOUNTER — Other Ambulatory Visit (HOSPITAL_COMMUNITY): Payer: Self-pay

## 2022-03-04 ENCOUNTER — Other Ambulatory Visit: Payer: Self-pay

## 2022-03-07 ENCOUNTER — Other Ambulatory Visit: Payer: Self-pay

## 2022-03-08 ENCOUNTER — Other Ambulatory Visit (HOSPITAL_COMMUNITY): Payer: Self-pay

## 2022-03-08 MED ORDER — CELECOXIB 200 MG PO CAPS
200.0000 mg | ORAL_CAPSULE | Freq: Two times a day (BID) | ORAL | 1 refills | Status: DC
  Filled 2022-03-08 – 2022-03-12 (×2): qty 180, 90d supply, fill #0

## 2022-03-11 ENCOUNTER — Other Ambulatory Visit (HOSPITAL_COMMUNITY): Payer: Self-pay

## 2022-03-12 ENCOUNTER — Other Ambulatory Visit (HOSPITAL_COMMUNITY): Payer: Self-pay

## 2022-05-16 DIAGNOSIS — G8929 Other chronic pain: Secondary | ICD-10-CM | POA: Diagnosis not present

## 2022-05-16 DIAGNOSIS — Z0000 Encounter for general adult medical examination without abnormal findings: Secondary | ICD-10-CM | POA: Diagnosis not present

## 2022-05-16 DIAGNOSIS — M25562 Pain in left knee: Secondary | ICD-10-CM | POA: Diagnosis not present

## 2022-05-16 DIAGNOSIS — G4733 Obstructive sleep apnea (adult) (pediatric): Secondary | ICD-10-CM | POA: Diagnosis not present

## 2022-05-16 DIAGNOSIS — Z125 Encounter for screening for malignant neoplasm of prostate: Secondary | ICD-10-CM | POA: Diagnosis not present

## 2022-05-16 DIAGNOSIS — J302 Other seasonal allergic rhinitis: Secondary | ICD-10-CM | POA: Diagnosis not present

## 2022-05-16 DIAGNOSIS — I10 Essential (primary) hypertension: Secondary | ICD-10-CM | POA: Diagnosis not present

## 2022-05-16 DIAGNOSIS — Z6841 Body Mass Index (BMI) 40.0 and over, adult: Secondary | ICD-10-CM | POA: Diagnosis not present

## 2022-05-24 ENCOUNTER — Other Ambulatory Visit (HOSPITAL_COMMUNITY): Payer: Self-pay

## 2022-05-24 DIAGNOSIS — M546 Pain in thoracic spine: Secondary | ICD-10-CM | POA: Diagnosis not present

## 2022-05-24 DIAGNOSIS — M545 Low back pain, unspecified: Secondary | ICD-10-CM | POA: Diagnosis not present

## 2022-05-24 DIAGNOSIS — R7309 Other abnormal glucose: Secondary | ICD-10-CM | POA: Diagnosis not present

## 2022-05-24 DIAGNOSIS — D649 Anemia, unspecified: Secondary | ICD-10-CM | POA: Diagnosis not present

## 2022-05-24 DIAGNOSIS — Z0000 Encounter for general adult medical examination without abnormal findings: Secondary | ICD-10-CM | POA: Diagnosis not present

## 2022-05-24 DIAGNOSIS — G4733 Obstructive sleep apnea (adult) (pediatric): Secondary | ICD-10-CM | POA: Diagnosis not present

## 2022-05-24 DIAGNOSIS — I10 Essential (primary) hypertension: Secondary | ICD-10-CM | POA: Diagnosis not present

## 2022-05-24 DIAGNOSIS — G8929 Other chronic pain: Secondary | ICD-10-CM | POA: Diagnosis not present

## 2022-05-24 DIAGNOSIS — Z6839 Body mass index (BMI) 39.0-39.9, adult: Secondary | ICD-10-CM | POA: Diagnosis not present

## 2022-05-24 DIAGNOSIS — Z1331 Encounter for screening for depression: Secondary | ICD-10-CM | POA: Diagnosis not present

## 2022-05-24 MED ORDER — METOPROLOL SUCCINATE ER 50 MG PO TB24
75.0000 mg | ORAL_TABLET | Freq: Every day | ORAL | 3 refills | Status: AC
  Filled 2022-05-30: qty 135, 90d supply, fill #0

## 2022-05-24 MED ORDER — FINASTERIDE 5 MG PO TABS
5.0000 mg | ORAL_TABLET | Freq: Every day | ORAL | 3 refills | Status: AC
  Filled 2022-05-30 – 2022-06-04 (×2): qty 90, 90d supply, fill #0

## 2022-05-24 MED ORDER — TAMSULOSIN HCL 0.4 MG PO CAPS
0.4000 mg | ORAL_CAPSULE | Freq: Every day | ORAL | 3 refills | Status: AC
  Filled 2022-05-30: qty 90, 90d supply, fill #0

## 2022-05-24 MED ORDER — LOSARTAN POTASSIUM 25 MG PO TABS
25.0000 mg | ORAL_TABLET | Freq: Every day | ORAL | 3 refills | Status: AC
  Filled 2022-05-30 – 2022-06-04 (×2): qty 90, 90d supply, fill #0

## 2022-05-24 MED ORDER — CYCLOBENZAPRINE HCL 5 MG PO TABS
5.0000 mg | ORAL_TABLET | Freq: Every evening | ORAL | 3 refills | Status: AC | PRN
  Filled 2022-05-30: qty 90, 90d supply, fill #0

## 2022-05-24 MED ORDER — CELECOXIB 200 MG PO CAPS
200.0000 mg | ORAL_CAPSULE | Freq: Two times a day (BID) | ORAL | 3 refills | Status: DC
  Filled 2022-05-30 – 2022-06-04 (×2): qty 180, 90d supply, fill #0

## 2022-05-30 ENCOUNTER — Other Ambulatory Visit (HOSPITAL_COMMUNITY): Payer: Self-pay

## 2022-05-30 ENCOUNTER — Encounter (HOSPITAL_COMMUNITY): Payer: Self-pay

## 2022-05-30 ENCOUNTER — Other Ambulatory Visit: Payer: Self-pay

## 2022-06-04 ENCOUNTER — Other Ambulatory Visit (HOSPITAL_COMMUNITY): Payer: Self-pay

## 2022-06-04 ENCOUNTER — Other Ambulatory Visit: Payer: Self-pay

## 2022-06-13 ENCOUNTER — Other Ambulatory Visit (HOSPITAL_COMMUNITY): Payer: Self-pay

## 2022-06-13 ENCOUNTER — Other Ambulatory Visit: Payer: Self-pay

## 2022-06-13 MED ORDER — DRYSOL 20 % EX SOLN
CUTANEOUS | 5 refills | Status: AC
  Filled 2022-06-13: qty 35, 30d supply, fill #0

## 2022-08-05 ENCOUNTER — Other Ambulatory Visit (HOSPITAL_COMMUNITY): Payer: Self-pay

## 2024-03-10 ENCOUNTER — Encounter: Payer: Self-pay | Admitting: Emergency Medicine

## 2024-03-10 ENCOUNTER — Ambulatory Visit
Admission: EM | Admit: 2024-03-10 | Discharge: 2024-03-10 | Disposition: A | Discharge status: Home / Self Care | Source: Home / Self Care

## 2024-03-10 DIAGNOSIS — R350 Frequency of micturition: Secondary | ICD-10-CM | POA: Diagnosis not present

## 2024-03-10 LAB — POCT URINE DIPSTICK
Bilirubin, UA: NEGATIVE
Glucose, UA: NEGATIVE mg/dL
Ketones, POC UA: NEGATIVE mg/dL
Nitrite, UA: NEGATIVE
POC PROTEIN,UA: NEGATIVE
Spec Grav, UA: <=1.005 — AB
Urobilinogen, UA: 0.2 U/dL
pH, UA: 6

## 2024-03-10 MED ORDER — NITROFURANTOIN MONOHYD MACRO 100 MG PO CAPS: 100.0000 mg | ORAL_CAPSULE | Freq: Two times a day (BID) | ORAL | 0 refills | Status: AC

## 2024-03-10 NOTE — Discharge Instructions (Signed)
 Your urinalysis shows Kelsa Jaworowski blood cells, no bacteria at this times, your urine will be sent to the lab to determine exactly which bacteria is present, if any changes need to be made to your medications you will be notified  Begin use of Macrobid  twice a day for 7 days   You may use over-the-counter Azo to help minimize your symptoms until antibiotic removes bacteria, this medication will turn your urine orange  Increase your fluid intake through use of water  If urine sample does not show bacteria and you continue to have symptoms it would be recommended that you follow-up with your primary doctor for evaluation of kidney stones, unable to assess for this here in urgent care as we do not have CT imaging

## 2024-03-10 NOTE — ED Triage Notes (Signed)
 Patient reports chills, frequent urination, painful urination, nausea and bilateral flank pain that started 5 days ago. Patient has taken Tylenol 500 mg po or at 6 am with no relief. Rates pain 5/10.

## 2024-03-10 NOTE — ED Provider Notes (Signed)
 " Caleb Li    CSN: [id number] Arrival date & time: 03/10/24  0801      History   Chief Complaint Chief Complaint  Patient presents with   Nausea   Chills   Urinary Frequency   Flank Pain    HPI Caleb Li is a 69 y.o. male.   Patient presents for evaluation of chills, nausea and vomiting, dysuria, urinary frequency and bilateral low back pain present for 5 days.  Symptoms started abruptly.  Has begun to improve over the past 2 days but still present.  Has attempted use of Tylenol with some relief.  Denies hematuria, penile discharge, penile or testicle swelling, fever.     Past Medical History:  Diagnosis Date   BPH (benign prostatic hyperplasia)    Heart murmur    Hypertension    Sleep apnea     There are no active problems to display for this patient.   Past Surgical History:  Procedure Laterality Date   COLONOSCOPY WITH PROPOFOL  N/A 07/31/2021   Procedure: COLONOSCOPY WITH PROPOFOL ;  Surgeon: Maryruth Ole DASEN, MD;  Location: [facility] ENDOSCOPY;  Service: Endoscopy;  Laterality: N/A;   TONSILLECTOMY     VASECTOMY         Home Medications    Prior to Admission medications  Medication Sig Start Date End Date Taking? Authorizing Provider  Cholecalciferol (VITAMIN D-1000 MAX ST) 25 MCG (1000 UT) tablet Take 1,000 Units by mouth daily. 03/31/23  Yes [provider]  cyclobenzaprine  (FLEXERIL ) 5 MG tablet Take 5 mg by mouth at bedtime. 05/24/22  Yes [provider]  finasteride  (PROSCAR ) 5 MG tablet Take 5 mg by mouth daily. 03/31/23  Yes [provider]  aluminum  chloride (DRYSOL) 20 % external solution Apply topically at bedtime Apply solution sparingly. 06/13/22     aspirin EC 81 MG tablet Take 81 mg by mouth daily.    [provider]  atorvastatin (LIPITOR) 20 MG tablet Take 20 mg by mouth daily.    [provider]  celecoxib  (CELEBREX ) 200 MG capsule Take 1 capsule by mouth 2 times daily Patient not  taking: Reported on 07/31/2021 12/18/20     clotrimazole  (LOTRIMIN ) 1 % cream Apply topically 2 (two) times daily 01/03/22     COVID-19 mRNA bivalent vaccine, Pfizer, (PFIZER COVID-19 VAC BIVALENT) injection Inject into the muscle. 12/15/20   Luiz Channel, MD  cyclobenzaprine  (FLEXERIL ) 5 MG tablet Take 5 mg by mouth 3 (three) times daily as needed for muscle spasms.    [provider]  cyclobenzaprine  (FLEXERIL ) 5 MG tablet Take 1 tablet (5 mg total) by mouth nightly as needed for Muscle spasms 05/18/20     cyclobenzaprine  (FLEXERIL ) 5 MG tablet Take 1 tablet (5 mg total) by mouth at bedtime as needed for muscle spasms. 05/24/22     finasteride  (PROSCAR ) 5 MG tablet Take 1 tablet (5 mg total) by mouth once daily 05/18/20     finasteride  (PROSCAR ) 5 MG tablet Take 1 tablet (5 mg total) by mouth daily. 05/24/22     losartan  (COZAAR ) 25 MG tablet Take 1 tablet (25 mg total) by mouth once daily 05/18/20     losartan  (COZAAR ) 25 MG tablet Take 1 tablet (25 mg total) by mouth once daily 05/24/22     meloxicam  (MOBIC ) 7.5 MG tablet TAKE 1 TABLET BY MOUTH ONCE DAILY. 03/18/14   [provider]  meloxicam  (MOBIC ) 7.5 MG tablet Take 1 tablet (7.5 mg total) by mouth daily. 10/23/21  metoprolol  succinate (TOPROL -XL) 50 MG 24 hr tablet Take by mouth. 11/26/13   [provider]  metoprolol  succinate (TOPROL -XL) 50 MG 24 hr tablet TAKE 1 AND 1/2 TABLETS BY MOUTH DAILY 05/18/19 07/31/21  Sadie Manna, MD  metoprolol  succinate (TOPROL -XL) 50 MG 24 hr tablet Take 1.5 tablets (75 mg total) by mouth daily. 05/24/22     Metoprolol  Tartrate 75 MG TABS Take 75 mg by mouth daily.    [provider]  mometasone  (ELOCON ) 0.1 % lotion Put 2 - 4 drops into each ear at bedtime as needed for dry skin/irritation in ear 01/08/22     tamsulosin  (FLOMAX ) 0.4 MG CAPS capsule TAKE ONE CAPSULE BY MOUTH DAILY 02/21/14   [provider]  tamsulosin  (FLOMAX ) 0.4 MG CAPS capsule Take 1 capsule  (0.4 mg total) by mouth daily take 30 minutes after the same meal each day. 05/24/22       Family History Family History  Problem Relation Age of Onset   Tuberculosis Mother     Social History Social History[1]   Allergies   Patient has no known allergies.   Review of Systems Review of Systems  Constitutional:  Positive for chills. Negative for activity change, appetite change, diaphoresis, fatigue, fever and unexpected weight change.  Genitourinary:  Positive for dysuria, flank pain and frequency. Negative for decreased urine volume, difficulty urinating, enuresis, genital sores, hematuria, penile discharge, penile pain, penile swelling, scrotal swelling, testicular pain and urgency.     Physical Exam Triage Vital Signs ED Triage Vitals  Encounter Vitals Group     BP 03/10/24 0814 124/74     Girls Systolic BP Percentile --      Girls Diastolic BP Percentile --      Boys Systolic BP Percentile --      Boys Diastolic BP Percentile --      Pulse Rate 03/10/24 0814 70     Resp 03/10/24 0814 18     Temp 03/10/24 0814 98.1 F (36.7 C)     Temp Source 03/10/24 0814 Oral     SpO2 03/10/24 0814 96 %     Weight --      Height --      Head Circumference --      Peak Flow --      Pain Score 03/10/24 0818 5     Pain Loc --      Pain Education --      Exclude from Growth Chart --    No data found.  Updated Vital Signs BP 124/74 (BP Location: Right Arm)   Pulse 70   Temp 98.1 F (36.7 C) (Oral)   Resp 18   SpO2 96%   Visual Acuity Right Eye Distance:   Left Eye Distance:   Bilateral Distance:    Right Eye Near:   Left Eye Near:    Bilateral Near:     Physical Exam Constitutional:      Appearance: Normal appearance.  Eyes:     Extraocular Movements: Extraocular movements intact.  Pulmonary:     Effort: Pulmonary effort is normal.  Abdominal:     Tenderness: There is no abdominal tenderness. There is no right CVA tenderness, left CVA tenderness, guarding or  rebound.  Neurological:     Mental Status: He is alert and oriented to person, place, and time. Mental status is at baseline.      UC Treatments / Results  Labs (all labs ordered are listed, but only abnormal results are displayed)  Labs Reviewed  URINE CULTURE  POCT URINE DIPSTICK    EKG   Radiology No results found.  Procedures Procedures (including critical care time)  Medications Ordered in UC Medications - No data to display  Initial Impression / Assessment and Plan / UC Course  I have reviewed the triage vital signs and the nursing notes.  Pertinent labs & imaging results that were available during my care of the patient were reviewed by me and considered in my medical decision making (see chart for details).  Urinary frequency  Urinalysis showing leukocytes and moderate red blood cells, negative for nitrates, discussed findings with patient empirically placing on Macrobid  to see if symptomatic alternative etiology possibly kidney stones, discussed this with patient and if urine culture is negative advised PCP follow-up, recommend over-the-counter medications and nonpharmacological supportive care with follow-up with urgent care as needed Final Clinical Impressions(s) / UC Diagnoses   Final diagnoses:  Urinary frequency   Discharge Instructions   None    ED Prescriptions   None    PDMP not reviewed this encounter.     [1]  Social History Tobacco Use   Smoking status: Never   Smokeless tobacco: Never  Vaping Use   Vaping status: Never Used  Substance Use Topics   Alcohol use: No    Alcohol/week: 0.0 standard drinks of alcohol   Drug use: No     Teresa Shelba SAUNDERS, NP 03/10/24 0901  "

## 2024-03-12 ENCOUNTER — Ambulatory Visit (HOSPITAL_COMMUNITY): Payer: Self-pay

## 2024-03-12 ENCOUNTER — Other Ambulatory Visit: Payer: Self-pay

## 2024-03-12 ENCOUNTER — Other Ambulatory Visit (HOSPITAL_COMMUNITY): Payer: Self-pay

## 2024-03-12 LAB — URINE CULTURE: Culture: >=100000 — AB

## 2024-03-12 MED ORDER — SULFAMETHOXAZOLE-TRIMETHOPRIM 800-160 MG PO TABS
1.0000 | ORAL_TABLET | Freq: Two times a day (BID) | ORAL | 0 refills | Status: AC
  Filled 2024-03-12: qty 6, 3d supply, fill #0

## 2024-08-09 ENCOUNTER — Ambulatory Visit: Admit: 2024-08-09

## 2024-08-09 SURGERY — COLONOSCOPY
Anesthesia: General
